# Patient Record
Sex: Male | Born: 1975 | Race: White | Hispanic: Yes | Marital: Single | Smoking: Former smoker
Health system: Southern US, Community
[De-identification: ages and names within clinical notes are randomized; demographics above are authoritative.]

## PROBLEM LIST (undated history)

## (undated) DIAGNOSIS — F1010 Alcohol abuse, uncomplicated: Secondary | ICD-10-CM

## (undated) DIAGNOSIS — K219 Gastro-esophageal reflux disease without esophagitis: Secondary | ICD-10-CM

## (undated) DIAGNOSIS — E079 Disorder of thyroid, unspecified: Secondary | ICD-10-CM

## (undated) DIAGNOSIS — F1910 Other psychoactive substance abuse, uncomplicated: Secondary | ICD-10-CM

## (undated) HISTORY — DX: Other psychoactive substance abuse, uncomplicated: F19.10

## (undated) HISTORY — DX: Disorder of thyroid, unspecified: E07.9

## (undated) HISTORY — DX: Gastro-esophageal reflux disease without esophagitis: K21.9

## (undated) HISTORY — PX: THYROIDECTOMY: SHX17

## (undated) HISTORY — DX: Alcohol abuse, uncomplicated: F10.10

---

## 2003-08-21 ENCOUNTER — Emergency Department (HOSPITAL_COMMUNITY): Admission: EM | Admit: 2003-08-21 | Discharge: 2003-08-21 | Payer: Self-pay | Admitting: *Deleted

## 2003-09-01 ENCOUNTER — Encounter: Admission: RE | Admit: 2003-09-01 | Discharge: 2003-09-01 | Payer: Self-pay | Admitting: Internal Medicine

## 2003-09-08 ENCOUNTER — Encounter: Admission: RE | Admit: 2003-09-08 | Discharge: 2003-09-08 | Payer: Self-pay | Admitting: Internal Medicine

## 2003-10-09 ENCOUNTER — Encounter: Admission: RE | Admit: 2003-10-09 | Discharge: 2003-10-09 | Payer: Self-pay | Admitting: Internal Medicine

## 2003-10-12 ENCOUNTER — Encounter (HOSPITAL_COMMUNITY): Admission: RE | Admit: 2003-10-12 | Discharge: 2004-01-10 | Payer: Self-pay | Admitting: Internal Medicine

## 2007-02-18 ENCOUNTER — Ambulatory Visit: Payer: Self-pay | Admitting: Sports Medicine

## 2007-02-18 ENCOUNTER — Encounter: Payer: Self-pay | Admitting: Family Medicine

## 2007-02-18 LAB — CONVERTED CEMR LAB
T3, Free: 3.3 pg/mL (ref 2.3–4.2)
TSH: 3.174 microintl units/mL (ref 0.350–5.50)

## 2007-02-21 ENCOUNTER — Encounter: Payer: Self-pay | Admitting: Family Medicine

## 2007-02-22 ENCOUNTER — Ambulatory Visit: Payer: Self-pay

## 2007-02-22 ENCOUNTER — Encounter: Payer: Self-pay | Admitting: Family Medicine

## 2007-02-22 LAB — CONVERTED CEMR LAB
ALT: 119 units/L — ABNORMAL HIGH (ref 0–53)
Albumin: 4.8 g/dL (ref 3.5–5.2)
Alkaline Phosphatase: 87 units/L (ref 39–117)
Potassium: 4.4 meq/L (ref 3.5–5.3)
Sodium: 141 meq/L (ref 135–145)
Total Bilirubin: 0.6 mg/dL (ref 0.3–1.2)
Total Protein: 7.5 g/dL (ref 6.0–8.3)

## 2007-02-27 ENCOUNTER — Telehealth: Payer: Self-pay | Admitting: *Deleted

## 2007-02-28 ENCOUNTER — Encounter: Payer: Self-pay | Admitting: Family Medicine

## 2007-03-04 ENCOUNTER — Ambulatory Visit (HOSPITAL_COMMUNITY): Admission: RE | Admit: 2007-03-04 | Discharge: 2007-03-04 | Payer: Self-pay | Admitting: Family Medicine

## 2007-03-15 ENCOUNTER — Encounter: Payer: Self-pay | Admitting: Family Medicine

## 2007-03-18 ENCOUNTER — Telehealth (INDEPENDENT_AMBULATORY_CARE_PROVIDER_SITE_OTHER): Payer: Self-pay | Admitting: Family Medicine

## 2007-03-20 ENCOUNTER — Encounter: Payer: Self-pay | Admitting: Family Medicine

## 2007-03-20 ENCOUNTER — Encounter (INDEPENDENT_AMBULATORY_CARE_PROVIDER_SITE_OTHER): Payer: Self-pay | Admitting: Interventional Radiology

## 2007-03-20 ENCOUNTER — Ambulatory Visit (HOSPITAL_COMMUNITY): Admission: RE | Admit: 2007-03-20 | Discharge: 2007-03-20 | Payer: Self-pay | Admitting: Family Medicine

## 2007-04-05 ENCOUNTER — Encounter: Payer: Self-pay | Admitting: Family Medicine

## 2007-04-11 ENCOUNTER — Telehealth: Payer: Self-pay | Admitting: *Deleted

## 2007-05-02 ENCOUNTER — Encounter: Payer: Self-pay | Admitting: Family Medicine

## 2007-05-06 ENCOUNTER — Encounter: Payer: Self-pay | Admitting: *Deleted

## 2007-05-07 ENCOUNTER — Encounter: Payer: Self-pay | Admitting: Family Medicine

## 2007-05-16 ENCOUNTER — Telehealth: Payer: Self-pay | Admitting: *Deleted

## 2007-07-04 ENCOUNTER — Ambulatory Visit (HOSPITAL_COMMUNITY): Admission: RE | Admit: 2007-07-04 | Discharge: 2007-07-05 | Payer: Self-pay | Admitting: Surgery

## 2007-07-04 ENCOUNTER — Encounter (INDEPENDENT_AMBULATORY_CARE_PROVIDER_SITE_OTHER): Payer: Self-pay | Admitting: Surgery

## 2007-07-11 ENCOUNTER — Encounter: Payer: Self-pay | Admitting: Family Medicine

## 2007-12-27 ENCOUNTER — Encounter: Payer: Self-pay | Admitting: Family Medicine

## 2008-02-15 ENCOUNTER — Encounter: Payer: Self-pay | Admitting: Family Medicine

## 2008-03-27 ENCOUNTER — Ambulatory Visit: Payer: Self-pay | Admitting: Family Medicine

## 2008-03-27 ENCOUNTER — Encounter: Payer: Self-pay | Admitting: Family Medicine

## 2008-03-27 DIAGNOSIS — E890 Postprocedural hypothyroidism: Secondary | ICD-10-CM

## 2008-03-30 LAB — CONVERTED CEMR LAB
Albumin: 5.2 g/dL (ref 3.5–5.2)
BUN: 17 mg/dL (ref 6–23)
CO2: 24 meq/L (ref 19–32)
Calcium: 10.2 mg/dL (ref 8.4–10.5)
Chloride: 102 meq/L (ref 96–112)
Creatinine, Ser: 1.02 mg/dL (ref 0.40–1.50)
Direct LDL: 128 mg/dL — ABNORMAL HIGH
Glucose, Bld: 84 mg/dL (ref 70–99)
Potassium: 3.9 meq/L (ref 3.5–5.3)
TSH: 16.044 microintl units/mL — ABNORMAL HIGH (ref 0.350–4.50)

## 2008-05-05 ENCOUNTER — Encounter: Payer: Self-pay | Admitting: Family Medicine

## 2008-05-05 ENCOUNTER — Ambulatory Visit: Payer: Self-pay | Admitting: Family Medicine

## 2008-05-06 LAB — CONVERTED CEMR LAB: TSH: 15.225 microintl units/mL — ABNORMAL HIGH (ref 0.350–4.50)

## 2008-07-24 ENCOUNTER — Encounter: Payer: Self-pay | Admitting: Family Medicine

## 2008-07-24 ENCOUNTER — Ambulatory Visit: Payer: Self-pay | Admitting: Family Medicine

## 2008-07-27 LAB — CONVERTED CEMR LAB: TSH: 0.875 microintl units/mL (ref 0.350–4.500)

## 2008-10-26 IMAGING — US US SOFT TISSUE HEAD/NECK
1 series · 14 of 25 positions shown · non-contrast
Comparison: none

CLINICAL DATA: Goiter.  
THYROID ULTRASOUND ? 03/04/07:
TECHNIQUE: Ultrasound examination of the thyroid gland and adjacent soft tissue structures was performed.   
No comparison ultrasound.  There is a comparison CT scan performed 10/27/03 at which time a large left thyroid mass was noted.

[Series 1: unknown · 0.09mm/px · 14 of 25 slices shown]
[im 1/25]
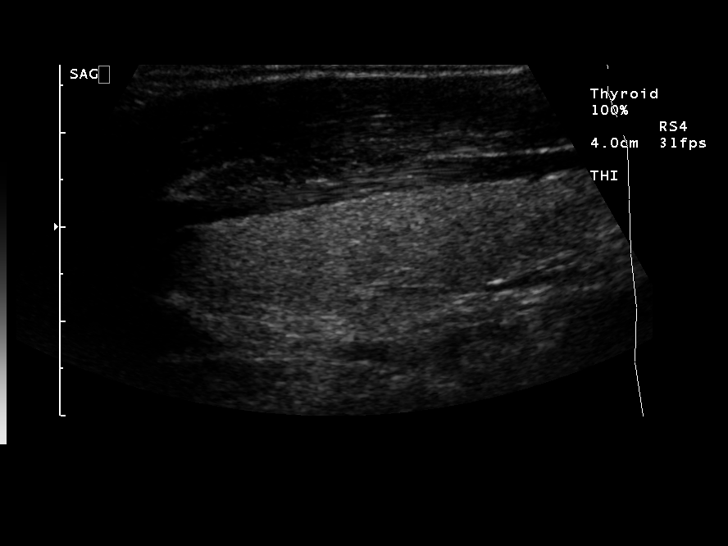
[im 3/25]
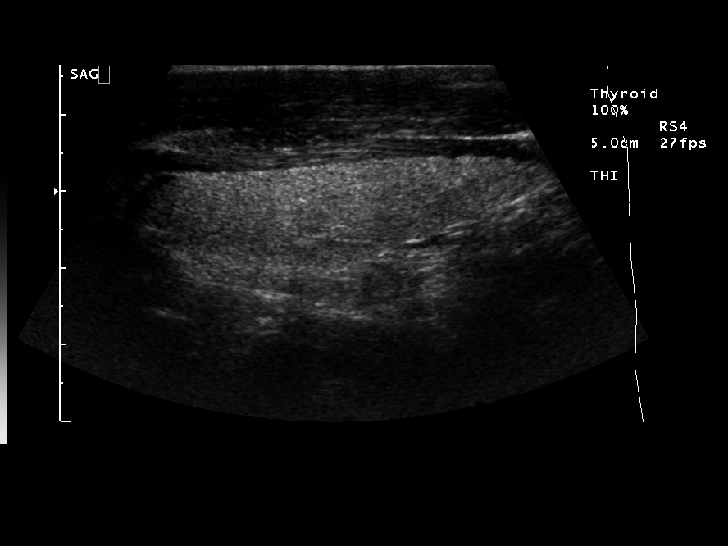
[im 5/25]
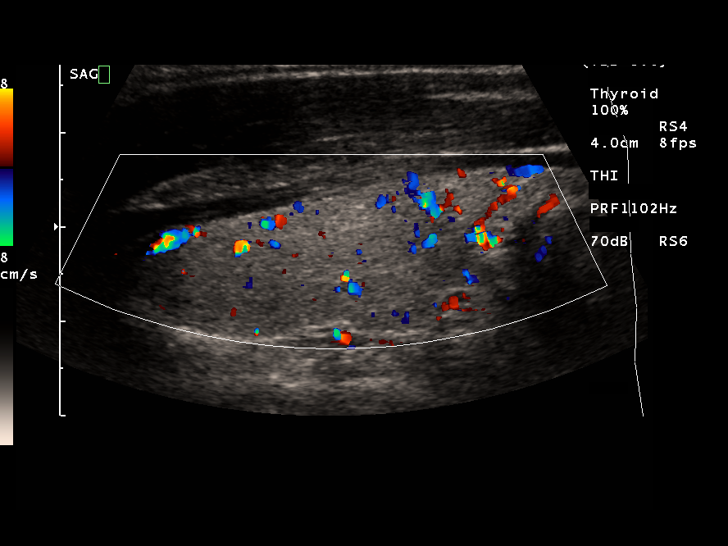
[im 7/25]
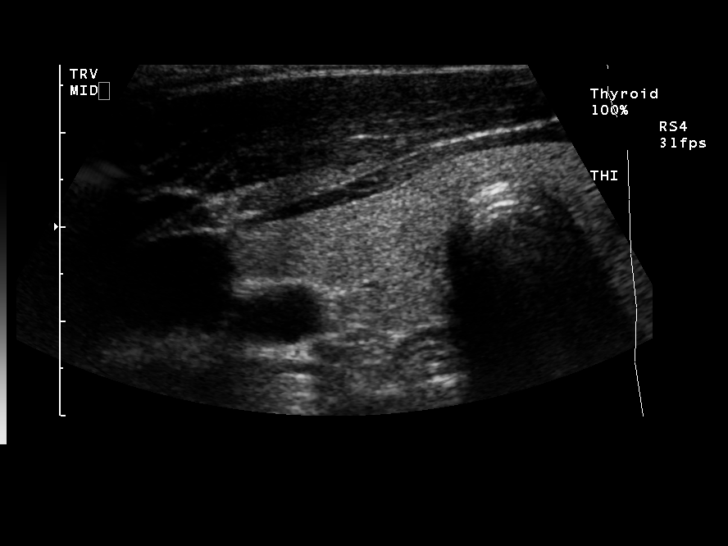
[im 9/25]
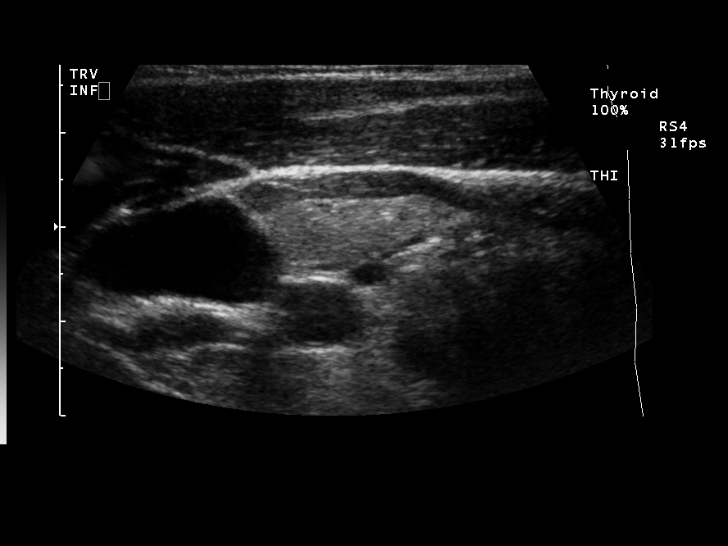
[im 10/25]
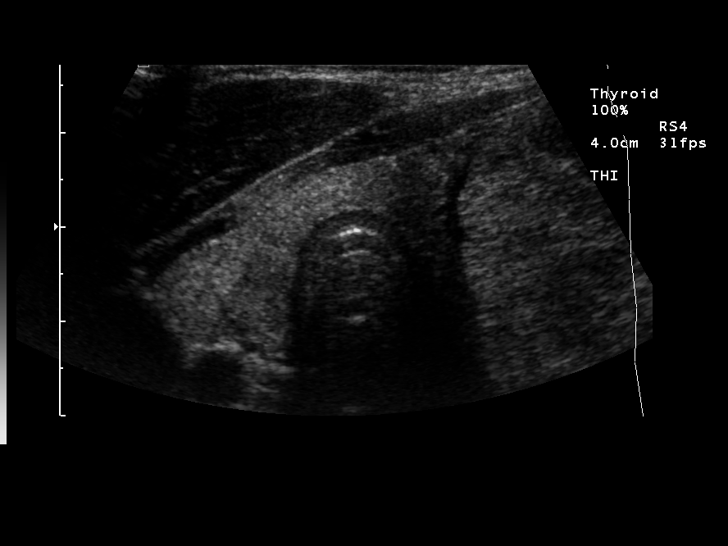
[im 12/25]
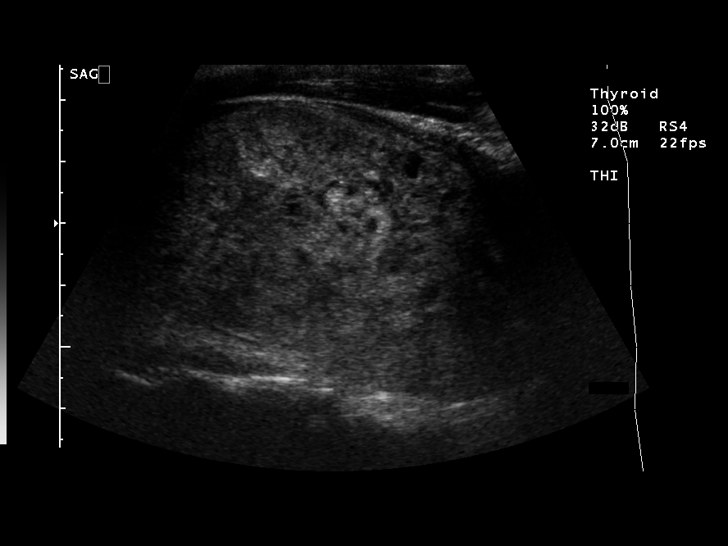
[im 14/25]
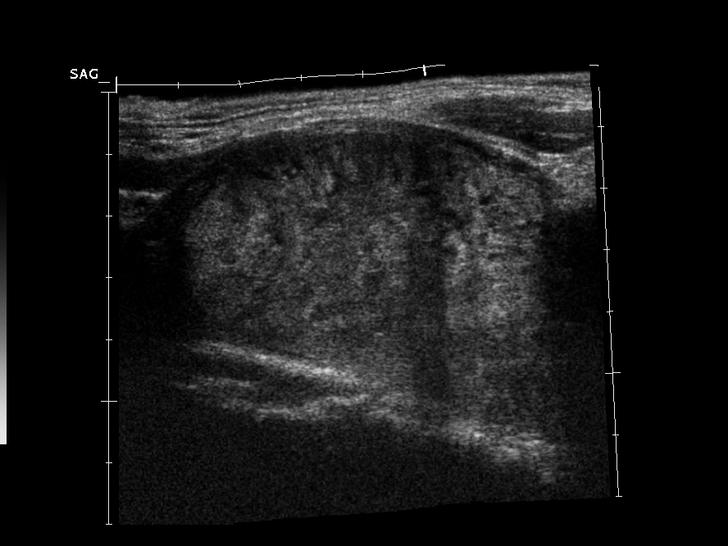
[im 16/25]
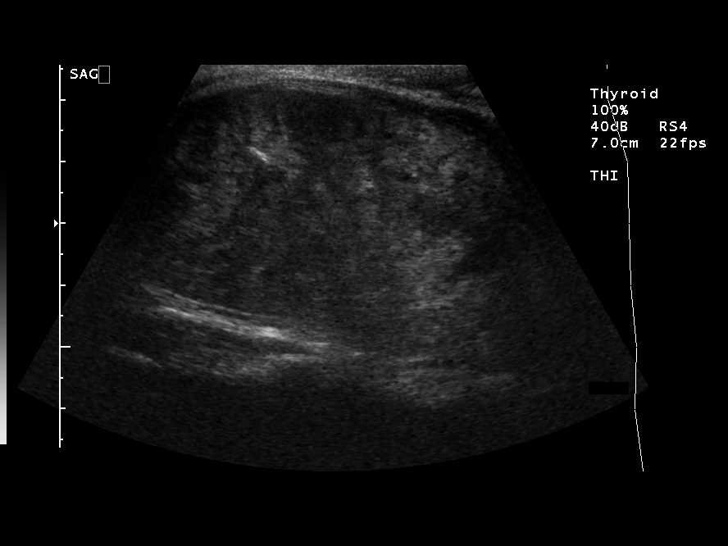
[im 17/25]
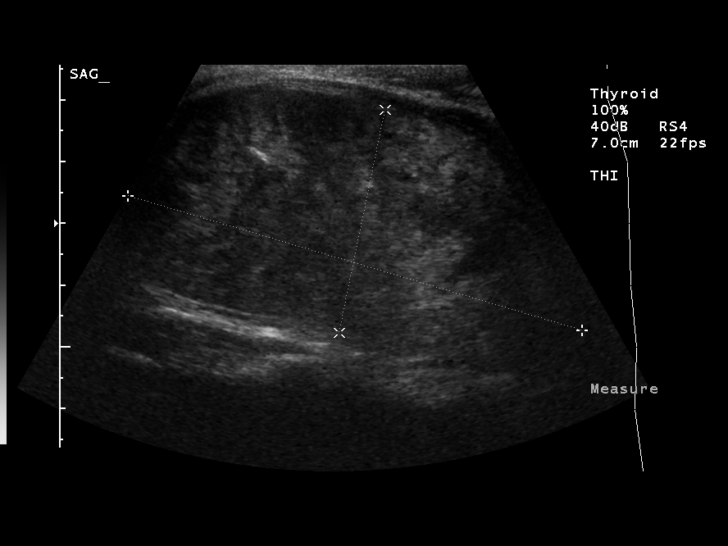
[im 19/25]
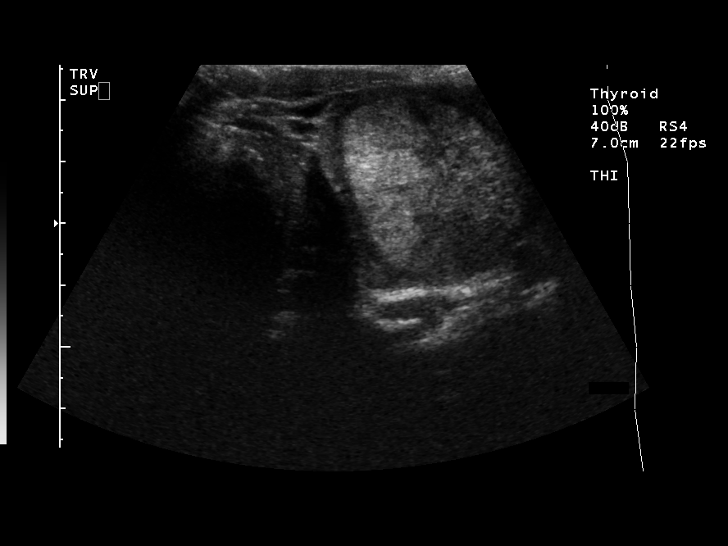
[im 21/25]
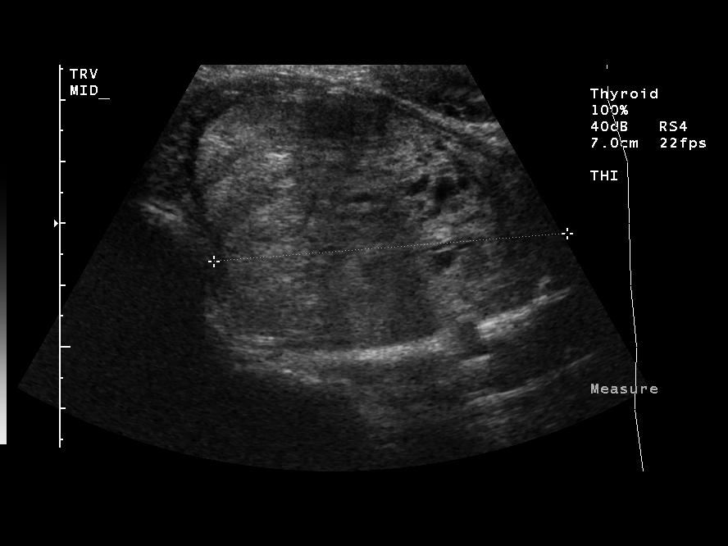
[im 23/25]
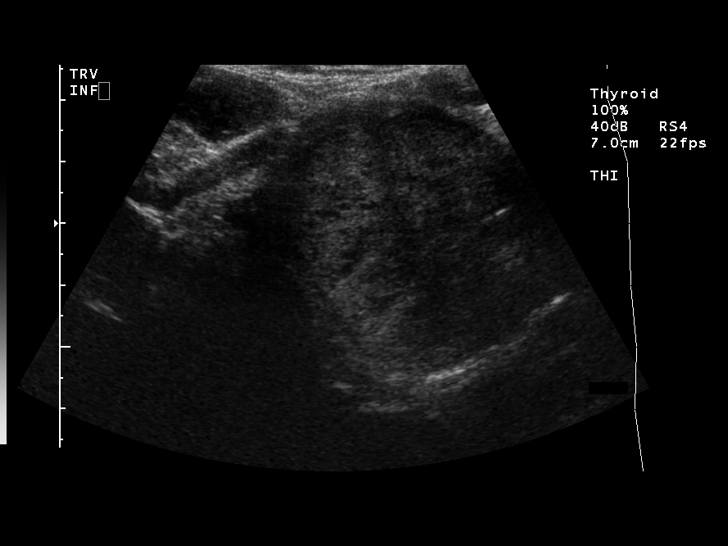
[im 25/25]
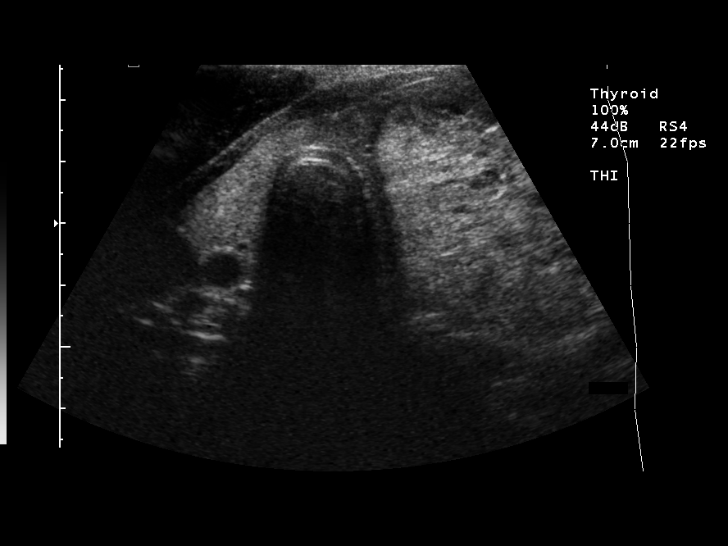

[14 of 25 positions shown; findings below may reference images not displayed]

FINDINGS: The right lobe of the thyroid gland measures 5.5 x 1.4 x 2.3 cm without a dominant mass. The left lobe of the thyroid gland measures 8.4 x 4.3 x 5.7 cm containing a dominant heterogeneous mass measuring 7.7 x 3.7 x 5.1 cm.  Fine needle aspirate recommended to exclude malignancy.  The isthmus is 0.5 cm.
IMPRESSION: Enlarge left lobe of thyroid gland containing a large dominant heterogeneous mass.  Fine needle aspirate recommended for further delineation to exclude an underlying malignancy.

## 2009-03-26 ENCOUNTER — Telehealth: Payer: Self-pay | Admitting: Family Medicine

## 2009-05-10 ENCOUNTER — Telehealth: Payer: Self-pay | Admitting: Family Medicine

## 2009-05-17 ENCOUNTER — Ambulatory Visit: Payer: Self-pay | Admitting: Family Medicine

## 2009-05-17 ENCOUNTER — Encounter: Payer: Self-pay | Admitting: Family Medicine

## 2009-10-04 ENCOUNTER — Ambulatory Visit: Payer: Self-pay | Admitting: Family Medicine

## 2009-10-04 ENCOUNTER — Encounter: Payer: Self-pay | Admitting: Family Medicine

## 2009-10-04 DIAGNOSIS — K219 Gastro-esophageal reflux disease without esophagitis: Secondary | ICD-10-CM

## 2009-10-04 DIAGNOSIS — F1010 Alcohol abuse, uncomplicated: Secondary | ICD-10-CM | POA: Insufficient documentation

## 2009-10-05 ENCOUNTER — Telehealth: Payer: Self-pay | Admitting: Family Medicine

## 2009-10-05 LAB — CONVERTED CEMR LAB
Albumin: 5.1 g/dL (ref 3.5–5.2)
Alkaline Phosphatase: 79 units/L (ref 39–117)
BUN: 9 mg/dL (ref 6–23)
Creatinine, Ser: 0.87 mg/dL (ref 0.40–1.50)
Glucose, Bld: 108 mg/dL — ABNORMAL HIGH (ref 70–99)
Potassium: 4.2 meq/L (ref 3.5–5.3)
Total Bilirubin: 0.4 mg/dL (ref 0.3–1.2)

## 2009-10-07 ENCOUNTER — Ambulatory Visit: Payer: Self-pay | Admitting: Family Medicine

## 2009-10-07 DIAGNOSIS — R109 Unspecified abdominal pain: Secondary | ICD-10-CM

## 2009-10-19 ENCOUNTER — Ambulatory Visit: Payer: Self-pay | Admitting: Family Medicine

## 2009-10-19 ENCOUNTER — Encounter: Payer: Self-pay | Admitting: Family Medicine

## 2009-10-19 LAB — CONVERTED CEMR LAB
Bilirubin Urine: NEGATIVE
Glucose, Urine, Semiquant: NEGATIVE
Ketones, urine, test strip: NEGATIVE
Lipase: 19 units/L (ref 0–75)
Protein, U semiquant: NEGATIVE
pH: 7.5

## 2009-10-21 ENCOUNTER — Ambulatory Visit (HOSPITAL_COMMUNITY): Admission: RE | Admit: 2009-10-21 | Discharge: 2009-10-21 | Payer: Self-pay | Admitting: Family Medicine

## 2009-10-26 ENCOUNTER — Telehealth: Payer: Self-pay | Admitting: Family Medicine

## 2009-10-27 ENCOUNTER — Telehealth (INDEPENDENT_AMBULATORY_CARE_PROVIDER_SITE_OTHER): Payer: Self-pay | Admitting: Family Medicine

## 2009-11-18 ENCOUNTER — Encounter: Payer: Self-pay | Admitting: Family Medicine

## 2009-11-18 ENCOUNTER — Ambulatory Visit: Payer: Self-pay | Admitting: Family Medicine

## 2009-11-19 LAB — CONVERTED CEMR LAB: TSH: 0.184 microintl units/mL — ABNORMAL LOW (ref 0.350–4.500)

## 2009-12-17 ENCOUNTER — Ambulatory Visit: Payer: Self-pay | Admitting: Family Medicine

## 2010-04-02 ENCOUNTER — Encounter: Payer: Self-pay | Admitting: Internal Medicine

## 2010-04-03 ENCOUNTER — Encounter: Payer: Self-pay | Admitting: Internal Medicine

## 2010-04-12 ENCOUNTER — Encounter: Payer: Self-pay | Admitting: Family Medicine

## 2010-04-12 ENCOUNTER — Ambulatory Visit
Admission: RE | Admit: 2010-04-12 | Discharge: 2010-04-12 | Payer: Self-pay | Source: Home / Self Care | Attending: Family Medicine | Admitting: Family Medicine

## 2010-04-12 NOTE — Progress Notes (Signed)
Summary: lab results  Phone Note Call from Patient Call back at Home Phone [phone]   Caller: Spouse-Gema Summary of Call: wants results of labs Initial call taken by: De Nurse,  October 05, 2009 11:17 AM  Follow-up for Phone Call        LM asking pt to call back Follow-up by: Golden Circle RN,  October 05, 2009 11:23 AM  Additional Follow-up for Phone Call Additional follow up Details #1::        wife called back. she gave me his [phone]. told her I will have a spanish speaking person call her spouse Additional Follow-up by: Golden Circle RN,  October 05, 2009 11:28 AM    Additional Follow-up for Phone Call Additional follow up Details #2::    used interpretor & asked him to come in thursday to see pcp to discuss results. appt made. told us he did not want an interpretor as his wife will be here & speak for him Follow-up by: Golden Circle RN,  October 05, 2009 2:18 PM

## 2010-04-12 NOTE — Progress Notes (Signed)
Summary: refill  Phone Note Refill Request Call back at Home Phone [phone] Message from:  spouse- Jema  Refills Requested: Medication #1:  SYNTHROID 175 MCG TABS 1 tab by mouth daily. Next Appointment Scheduled: 04/28/09 Initial call taken by: De Nurse,  March 26, 2009 11:22 AM  Follow-up for Phone Call        to pcp Follow-up by: Golden Circle RN,  March 26, 2009 11:41 AM    Prescriptions: SYNTHROID 175 MCG TABS (LEVOTHYROXINE SODIUM) 1 tab by mouth daily  #34 x 0   Entered and Authorized by:   Angeline Slim MD   Signed by:   Angeline Slim MD on 03/26/2009   Method used:   Electronically to        [facility]. [phone]* (retail)       [address]       [place], [place]  [address]       Ph: [phone]       Fax: [phone]   RxID:   [id number]

## 2010-04-12 NOTE — Assessment & Plan Note (Signed)
Summary: flu shot,tcb  Nurse Visit   Vital Signs:  Patient profile:   35 year old male Temp:     98.9 degrees F  Vitals Entered By: Theresia Lo RN (December 17, 2009 3:55 PM)  Allergies: No Known Drug Allergies  Immunizations Administered:  Influenza Vaccine # 1:    Vaccine Type: Fluvax 3+    Site: right deltoid    Mfr: GlaxoSmithKline    Dose: 0.5 ml    Route: IM    Given by: Theresia Lo RN    Exp. Date: 09/07/2010    Lot #: [id number]    VIS given: 10/05/09 version given December 17, 2009.  Flu Vaccine Consent Questions:    Do you have a history of severe allergic reactions to this vaccine? no    Any prior history of allergic reactions to egg and/or gelatin? no    Do you have a sensitivity to the preservative Thimersol? no    Do you have a past history of Guillan-Barre Syndrome? no    Do you currently have an acute febrile illness? no    Have you ever had a severe reaction to latex? no    Vaccine information given and explained to patient? yes  Orders Added: 1)  Flu Vaccine 58yrs + [90658] 2)  Admin 1st Vaccine [90471]   Orders Added: 1)  Flu Vaccine 72yrs + [90658] 2)  Admin 1st Vaccine [09811]

## 2010-04-12 NOTE — Progress Notes (Signed)
Summary: Rx Req  Phone Note Refill Request Call back at [phone] Message from:  SPOUSE-GEMA  Refills Requested: Medication #1:  SYNTHROID 175 MCG TABS 1 tab by mouth daily. PT USE [facility].  PT WORKING OUT OF TOWN RIGHT NOW WILL TRY AND GET BACK TO MAKE APPT FOR APRIL.  Initial call taken by: Clydell Hakim,  May 10, 2009 11:46 AM  Follow-up for Phone Call        Called wife back. Discussed that I cannot refill med anymore since pt has not been seen by me and has not been seen in clinic since 03/2008.  I am willing to prescribe 1 wk's worth of med if pt comes in this week for TSH checked.  Wife assured me that pt will come in to clinic this week for TSH check.  After that I would like to see pt in clinic.  Follow-up by: Raesha Coonrod MD,  May 10, 2009 3:13 PM    Prescriptions: SYNTHROID 175 MCG TABS (LEVOTHYROXINE SODIUM) 1 tab by mouth daily  #10 x 0   Entered and Authorized by:   Angeline Slim MD   Signed by:   Angeline Slim MD on 05/10/2009   Method used:   Electronically to        [facility]. [phone]* (retail)       [address]       [place], [place]  [address]       Ph: [phone]       Fax: [phone]   RxID:   [id number]

## 2010-04-12 NOTE — Assessment & Plan Note (Signed)
Summary: pt feels like something in his throat,tcb   Vital Signs:  Patient profile:   35 year old male Height:      63.75 inches Weight:      182 pounds BMI:     31.60 BSA:     1.88 Temp:     98.0 degrees F Pulse rate:   82 / minute BP sitting:   138 / 88  Vitals Entered By: Jone Baseman CMA (October 04, 2009 10:02 AM) CC: feels like something in his throat Is Patient Diabetic? No Pain Assessment Patient in pain? no        Primary Care Provider:  Cat Ta MD  CC:  feels like something in his throat.  History of Present Illness: pt has had sensation of something caught in his throat for several years and in fact this was present since before his thyroidectomy and has never really resolved.  However, he believes it is worse over the last 2 weeks.  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  He states that his throat feels dry and like a little bit of his food or pills get caught there.  It does not help to drink lots of water.  Pt is a smoker (only socially) and a heavy drinker (>6 beers 3x/week).  Pt has no dysphagia, can swallow liquids and solids.    Thyroid- Pt notes that his dose of synthroid was increased 4 months ago.  he has occasional episodes of anxiety, chest palpitations, that are sometimes associated with his throat symptoms but not always.    Habits & Providers  Alcohol-Tobacco-Diet     Tobacco Status: never  Current Medications (verified): 1)  Synthroid 200 Mcg Tabs (Levothyroxine Sodium) .Marland Kitchen.. 1 Tab By Mouth Daily For Thyroid 2)  Omeprazole 20 Mg Cpdr (Omeprazole) .... Take One By Mouth Each Day  Allergies (verified): No Known Drug Allergies  Review of Systems  The patient denies anorexia, fever, weight loss, chest pain, syncope, prolonged cough, headaches, abdominal pain, and severe indigestion/heartburn.    Physical Exam  General:  vital signs reviewed slightly  hypertensive, better on recheck Well-developed,well-nourished,in no acute distress; alert,appropriate and cooperative throughout examination Head:  Normocephalic and atraumatic without obvious abnormalities. No apparent alopecia or balding. Mouth:  pharynx pink and moist, no erythema, no exudates, no posterior lymphoid hypertrophy, and no postnasal drip.   Neck:  No deformities, masses, or tenderness noted. surgical scar noted post thyroidectomy Chest Wall:  No deformities, masses, tenderness or gynecomastia noted. Lungs:  Normal respiratory effort, chest expands symmetrically. Lungs are clear to auscultation, no crackles or wheezes. Heart:  Normal rate and regular rhythm. S1 and S2 normal without gallop, murmur, click, rub or other extra sounds. Abdomen:  mild epigastric tenderness, otherwise soft, non-tender, normal bowel sounds, no distention, no masses, and no guarding.   Skin:  Intact without suspicious lesions or rashes Cervical Nodes:  No lymphadenopathy noted Psych:  Oriented X3 and good eye contact.  slightly anxious, slightly depressed appearing   Impression & Recommendations:  Problem # 1:  GERD (ICD-530.81) Assessment New this problem is likely related to GERD.  pt is a heavy drinker which can contribute to symptoms.  pt denies much acid taste or heartburn but is tender over epigastrum and drinks heavily which can contribute.  very unlikely to be due to another abd process.  pt's case is complicated slightly by his  thyroid disease and recent med increase.  If he is hyperthyroid and this is giving him symptoms, maybe that could contribute.  However, VS are stable and heart is not racing now, but has symptoms.  will treat with PPI.  f/u with PCP. His updated medication list for this problem includes:    Omeprazole 20 Mg Cpdr (Omeprazole) .Marland Kitchen... 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  PCP to adjust medication. His updated medication list for this problem includes:    Synthroid 200 Mcg Tabs (Levothyroxine sodium) .Marland Kitchen... 1 tab by mouth daily for thyroid  Orders: TSH-[facility] (62130-86578) [facility]- Est  Level 4 (46962)  Problem # 3:  ALCOHOL ABUSE (ICD-305.00) Assessment: New  pt drinks heavily 3x/ week.  wife became tearful when discussing this.  pt is open to the idea of beginning to cut back.  framed it in terms of his general well being as well as his GERD symptoms.  Wife would like him to cut back, apparently this is a source of stress for them.  began discussion today, PCP to continue.  Orders: [facility]- Est  Level 4 ([address]284)  Complete Medication List: 1)  Synthroid 200 Mcg Tabs (Levothyroxine sodium) .Marland Kitchen.. 1 tab by mouth daily for thyroid 2)  Omeprazole 20 Mg Cpdr (Omeprazole) .... Take one by mouth each day  Patient Instructions: 1)  It was nice to see you today. 2)  come back in 3-4 weeks to see Dr. Janalyn Harder 3)  We will check your thyroid level and call tomorrow with the new perscription 4)  Today we talked about acid reflux causing these symptoms and the role that beer may play in this.  Reducing your alcohol intake will help you feel better 5)  Avoid foods high in acid(tomatoes, citrus juices,spicy foods).Avoid eating within two hours of lying down or before exercising. Do not over eat: try smaller more frequent meals. Elevate head of bed twelve inches when sleeping.  6)  Also, while you are outside, try to drink some sports drinks as well as water, like gaterade. 7)  Fue agradable ver que [employer]. 8)  regrese en 3-4 semana por una cita con  Dr. Janalyn Harder 9)   Vamos a comprobar el nivel de la tiroides y Teaching laboratory technician con la nueva receta mdica 10)   Hoy hemos hablado sobre el reflujo cido que causa estos sntomas y el papel que la cerveza puede desempear en esto. Reducir el consumo de alcohol le ayudar a sentirse mejor 11)    Evite los alimentos ricos en cidos (tomate, jugos ctricos, los alimentos picantes). Evite comer dos horas despus de acostarse o antes de hacer ejercicio. No ms de comer: probar comidas ms pequeas con mayor frecuencia. Elevar la cabecera de la cama al dormir doce pulgadas. 12)   Adems, mientras se encuentra fuera, trate de beber algunas bebidas deportivas, as como el agua, como gaterade. Prescriptions: OMEPRAZOLE 20 MG CPDR (OMEPRAZOLE) take one by mouth each day  #30 x 11   Entered and Authorized by:   Ellery Plunk MD   Signed by:   Ellery Plunk MD on 10/04/2009   Method used:   Print then Give to Patient   RxID:   [id number]

## 2010-04-12 NOTE — Assessment & Plan Note (Signed)
Summary: f/up,tcb   Vital Signs:  Patient profile:   35 year old male Height:      63.75 inches Weight:      175 pounds BMI:     30.38 Temp:     98.4 degrees F oral Pulse rate:   69 / minute BP sitting:   116 / 74  (left arm) Cuff size:   regular  Vitals Entered By: Tessie Fass CMA (October 19, 2009 9:01 AM) CC: F/U Pain Assessment Patient in pain? yes     Location: abdomen Intensity: 6   Primary Care Provider:  Cat Ta MD  CC:  F/U.  History of Present Illness: 35 y/o M here for f/u of sensation of throat dryness thought to be 2/2 reflux and pt has since been started on omeprazole   ABDOMINAL PAIN Location: LUQ Onset: 1 month  Description: dull pain, pain worse with palpation, no pain when he is sitting in office, but only when he touches the area.  pain started again yesterday and has not decreased Modifying factors:    Symptoms Nausea/Vomiting: no Diarrhea:no  Constipation: no, last BM this morning and last night.  usually has 2 bm/day Melena/BRBPR: no Hematemesis: no Anorexia: no Fever/Chills: no Jaundice: no Dysuria: no Back pain: yes, present for 1 month now, same time as when abd pain started Rash: no Weight loss: no STD exposure: no Alcohol use: since 2 wks ago, he has stopped drinking alcohol. but before that he drank 6 beers/day NSAID use:   PMH: GERD dx 1 month ago Past Surgeries: thyroid      Current Medications (verified): 1)  Omeprazole 20 Mg Cpdr (Omeprazole) .... Take One By Mouth Each Day 2)  Synthroid 175 Mcg Tabs (Levothyroxine Sodium) .Marland Kitchen.. 1 Tab By Mouth Daily For Thyroid.  Please Use The Same Manufactureer For The Medicine 3)  Metamucil 30.9 % Powd (Psyllium) .Marland Kitchen.. 1 Gram By Mouth Daily. Dispense For 1 Month.  Allergies (verified): No Known Drug Allergies  Past History:  Past Medical History: Last updated: 03/27/2008 Postsurgical hypothyroidism  Past Surgical History: Last updated: 03/27/2008 Thyroidectomy - noncancerous  goiter  Family History: Last updated: 02/18/2007 Mother: HTN Father: None Brothers: Diabetes Sisters: HTN  Social History: Last updated: 10/07/2009 Lives with wife and three sons.  Wife is expecting another child.  Pt is from [place] and came to the [place] in 1999. Occupation: Holiday representative Married Never Smoked Alcohol use-yes; 5-6 beer per day Drug use-no Regular exercise-no  Risk Factors: Exercise: no (02/18/2007)  Risk Factors: Smoking Status: never (10/07/2009)  Review of Systems       per hpi   Physical Exam  General:  Well-developed,well-nourished,in no acute distress; alert,appropriate and cooperative throughout examination. vitals reviewed.  Abdomen:  soft, normal bowel sounds, no distention, no masses, no rigidity, no abdominal hernia, no inguinal hernia, no hepatomegaly, no splenomegaly, guarding, epigastric tenderness, and LUQ tenderness.   Rectal:  Hemocult negative    Impression & Recommendations:  Problem # 1:  ABDOMINAL PAIN (ICD-789.00) Assessment New LUQ abd pain.  Etiology includes pancreatitis vs pud vs splenic vs colonic process.  UA negative.  Will check lipase given pt's alcohol history.  Cmet done in July showed mildly elevated ALT, otherwise wnl.  Pt taking omeprazole x 2 wks which relieved throat dryines, but not abd pain.  Hemocult negative, which makes pud less likely.   Will get abd u/s.  If negative, may consider urease test or barium swallow test for UGI series.  Orders: Urinalysis-[facility] (  00000) Lipase-[facility] (84[address]-44010) Ultrasound (Ultrasound) [facility]- Est Level  3 (27253)  Complete Medication List: 1)  Omeprazole 20 Mg Cpdr (Omeprazole) .... Take one by mouth each day 2)  Synthroid 175 Mcg Tabs (Levothyroxine sodium) .Marland Kitchen.. 1 tab by mouth daily for thyroid.  please use the same manufactureer for the medicine 3)  Metamucil 30.9 % Powd (Psyllium) .Marland Kitchen.. 1 gram by mouth daily. dispense for 1 month.  Laboratory Results   Urine Tests  Date/Time  Received: October 19, 2009 9:26 AM  Date/Time Reported: October 19, 2009 9:31 AM   Routine Urinalysis   Color: yellow Appearance: Clear Glucose: negative   (Normal Range: Negative) Bilirubin: negative   (Normal Range: Negative) Ketone: negative   (Normal Range: Negative) Spec. Gravity: 1.020   (Normal Range: 1.003-1.035) Blood: negative   (Normal Range: Negative) pH: 7.5   (Normal Range: 5.0-8.0) Protein: negative   (Normal Range: Negative) Urobilinogen: 0.2   (Normal Range: 0-1) Nitrite: negative   (Normal Range: Negative) Leukocyte Esterace: negative   (Normal Range: Negative)    Comments: ...............test performed by......Marland KitchenBonnie A. [place], MLS (ASCP)cm

## 2010-04-12 NOTE — Progress Notes (Signed)
Summary: phn msg  Phone Note Call from Patient Call back at [phone]   Caller: Spouse Gema Summary of Call: Pt requesting that someone call back that can speak Spanish with the results. Initial call taken by: Clydell Hakim,  October 27, 2009 9:08 AM  Follow-up for Phone Call        I called wife back and gave her message from Dr. Janalyn Harder and she voices understanding.  see previous note.  Follow-up by: Theresia Lo RN,  October 27, 2009 9:27 AM

## 2010-04-12 NOTE — Progress Notes (Signed)
Summary: Test Res  Phone Note Call from Patient Call back at Home Phone [phone]   Caller: spouse-Gema Summary of Call: Wondering what results of ultrasound was from last week. Initial call taken by: Clydell Hakim,  October 26, 2009 11:18 AM  Follow-up for Phone Call        will forward message to MD. Follow-up by: Theresia Lo RN,  October 26, 2009 11:22 AM    I tried calling that number on Mon but it was disconnected. Cat Ta MD  October 27, 2009 8:43 AM No answer today.  Left message: Ultrasound showed that there is  1)68mm gall stone: should not be causing the abd pain 2)a lot of gas, which may be cause of some abd discomfort.   3)cont taking ppi as prescribed. 4)Lipase normal  Cat Ta MD  October 27, 2009 8:48 AM   spoke with wife because patient only speaks spanish. gave her message agian form Dr. Janalyn Harder as she did not understand message that was left. she states patient continues having the same pain since for several weeks now. will send  message to MD .  Theresia Lo RN  October 27, 2009 9:26 AM  Called back at above number.  Discussed with wife the results of ultrasound.  Would like pt to continue omeprazole and gas-x.  If still having pain in 2 months, I would like to see pt again. Wife agreed.  Cat Ta MD  October 27, 2009 5:55 PM

## 2010-04-12 NOTE — Assessment & Plan Note (Signed)
Summary: discuss labs/[place]/Lizzet Hendley   Vital Signs:  Patient profile:   35 year old male Height:      63.75 inches Weight:      174 pounds BMI:     30.21 Temp:     98.1 degrees F oral Pulse rate:   71 / minute BP sitting:   148 / 81  (left arm)  Vitals Entered By: Jimmy Footman, CMA (October 07, 2009 3:33 PM) CC: f/u on labwork, loss of appitite Is Patient Diabetic? No Pain Assessment Patient in pain? yes     Location: left side   Primary Care Provider:  Onesty Clair MD  CC:  f/u on labwork and loss of appitite.  History of Present Illness: This is my first time meeting patient.    35 y/o M is here to discuss thyroid.  He is s/p thyroidectomy and has been taking synthroid,but wife wants to discuss other things.   Thyroid:  feeling some fatigue.  no constipation.  BM normal until today, BM was soft. No diarrhea.  No mental status changes.    ABDOMINAL PAIN Location: epigastric to left upper quadrant Onset: 1 week Description: burning sensation, feels like someone is punching his stomach, pain radiated to back and chest.  Feels like pressure in head.   Symptoms Nausea/Vomiting:  no Diarrhea: soft stools since yesterday, but no diarrhea Constipation: no Melena/BRBPR: no Hematemesis: no Anorexia: 8 lbs weight loss since last visit 3 days ago  Fever/Chills: no fever, +chills on Mon Jaundice: no Dysuria: no Back pain: yes, abd pain radiates to back  Rash: no Weight loss: yes, 8 lbs in 3 days STD exposure: no Alcohol use: yes, 5-6 beers per day NSAID use: no Past Surgeries: thyroid  DRY THROAT:  seen by Dr Annita Brod 3 days ago.  Was Rx ppi.  Pt is taking prilosec otc.  Still complaining fo dry throat.  see her note from 7/25.  see ros from above.    Etoh abuse: drinks 5-6 beers daily.       Habits & Providers  Alcohol-Tobacco-Diet     Tobacco Status: never  Current Medications (verified): 1)  Omeprazole 20 Mg Cpdr (Omeprazole) .... Take One By Mouth Each Day 2)  Synthroid 175  Mcg Tabs (Levothyroxine Sodium) .Marland Kitchen.. 1 Tab By Mouth Daily For Thyroid.  Please Use The Same Manufactureer For The Medicine 3)  Metamucil 30.9 % Powd (Psyllium) .Marland Kitchen.. 1 Gram By Mouth Daily. Dispense For 1 Month.  Allergies (verified): No Known Drug Allergies  Past History:  Past Surgical History: Last updated: 03/27/2008 Thyroidectomy - noncancerous goiter  Family History: Last updated: 02/18/2007 Mother: HTN Father: None Brothers: Diabetes Sisters: HTN  Social History: Last updated: 10/07/2009 Lives with wife and three sons.  Wife is expecting another child.  Pt is from [place] and came to the [place] in 1999. Occupation: Holiday representative Married Never Smoked Alcohol use-yes; 5-6 beer per day Drug use-no Regular exercise-no  Risk Factors: Exercise: no (02/18/2007)  Risk Factors: Smoking Status: never (10/07/2009)  Past Medical History: Reviewed history from 03/27/2008 and no changes required. Postsurgical hypothyroidism  Social History: Lives with wife and three sons.  Wife is expecting another child.  Pt is from [place] and came to the [place] in 1999. Occupation: Holiday representative Married Never Smoked Alcohol use-yes; 5-6 beer per day Drug use-no Regular exercise-no  Review of Systems       per hpi  Physical Exam  General:  Well-developed,well-nourished,in no acute distress; alert,appropriate and cooperative throughout examination. vitals reviewed.  Neck:  No deformities, masses, or tenderness noted. Abdomen:  Bowel sounds positive,abdomen soft and  without masses, organomegaly or hernias noted. Pt endorses pain to deep palpation of LLQ, but there was no guarding.  no distention, no masses, no guarding, no rigidity, no rebound tenderness, no abdominal hernia, and no inguinal hernia.   Hyperactive bowel sounds Neurologic:  alert & oriented X3.   Cervical Nodes:  No lymphadenopathy noted   Impression & Recommendations:  Problem # 1:  ABDOMINAL PAIN (ICD-789.00) Assessment  New Abd exam is benign, besides hyperactive bowel sounds.  Pt had cmet that showed midly elevated AST/ALT, otherwise normal.  Will monitor for now.  Will try metamucil given hyperactive bowel sounds.  Discussed watching what he eats to identify cause of gas.   Orders: [facility]- Est  Level 4 ([address]214)  Problem # 2:  POSTSURGICAL HYPOTHYROIDISM (ICD-244.0) Assessment: Improved TSH improved and is 0.5, but most likely too low given complaints of fatigue and "not feeling right"  Will dec synthroid to 175.  Will recheck TSH in 6 wks.   The following medications were removed from the medication list:    Synthroid 200 Mcg Tabs (Levothyroxine sodium) .Marland Kitchen... 1 tab by mouth daily for thyroid His updated medication list for this problem includes:    Synthroid 175 Mcg Tabs (Levothyroxine sodium) .Marland Kitchen... 1 tab by mouth daily for thyroid.  please use the same manufactureer for the medicine  Orders: [facility]- Est  Level 4 ([address])Future Orders: TSH-[facility] ([id number]) ... 09/24/2010  Problem # 3:  GERD (ICD-530.81) Pt still complaining of sensation of dry throat.  I spent a lot of time discussing the need to remain on med prescribed by Dr Annita Brod.  Pt has only been on treatment for 2 days and therefore cannot expect quick relief of symptoms.   His updated medication list for this problem includes:    Omeprazole 20 Mg Cpdr (Omeprazole) .Marland Kitchen... Take one by mouth each day  Orders: [facility] Est  Level 4 ([address]284)  Problem # 4:  ALCOHOL ABUSE (ICD-305.00) Advised cessation.  Pt appears anxious and fidgety.  Last drink was 4-5 days ago.  It may be that some of his complaints of "feeling" not right is from withdrawal.   Orders: [facility]- Est  Level 4 ([address]214)  Complete Medication List: 1)  Omeprazole 20 Mg Cpdr (Omeprazole) .... Take one by mouth each day 2)  Synthroid 175 Mcg Tabs (Levothyroxine sodium) .Marland Kitchen.. 1 tab by mouth daily for thyroid.  please use the same manufactureer for the medicine 3)  Metamucil 30.9 % Powd (Psyllium) .Marland Kitchen.. 1  gram by mouth daily. dispense for 1 month. Prescriptions: METAMUCIL 30.9 % POWD (PSYLLIUM) 1 gram by mouth daily. Dispense for 1 month.  #1 x 3   Entered and Authorized by:   Angeline Slim MD   Signed by:   Angeline Slim MD on 10/07/2009   Method used:   Electronically to        [facility]. [phone]* (retail)       [address]       [place], [place]  [address]       Ph: [phone]       Fax: [phone]   RxID:   [id number] SYNTHROID 175 MCG TABS (LEVOTHYROXINE SODIUM) 1 tab by mouth daily for thyroid.  Please use the same manufactureer for the medicine  #30 x 1   Entered and Authorized by:   Angeline Slim MD  Signed by:   Angeline Slim MD on 10/07/2009   Method used:   Electronically to        [facility]. [phone]* (retail)       [address]       [place], [place]  [address]       Ph: [phone]       Fax: [phone]   RxID:   [id number]

## 2010-05-23 ENCOUNTER — Other Ambulatory Visit: Payer: Self-pay

## 2010-05-23 DIAGNOSIS — E890 Postprocedural hypothyroidism: Secondary | ICD-10-CM

## 2010-05-23 LAB — TSH: TSH: 3.832 u[IU]/mL (ref 0.350–4.500)

## 2010-05-24 ENCOUNTER — Telehealth: Payer: Self-pay | Admitting: Family Medicine

## 2010-05-24 MED ORDER — LEVOTHYROXINE SODIUM 150 MCG PO TABS: 150.0000 ug | ORAL_TABLET | Freq: Every day | ORAL | Status: DC

## 2010-05-24 NOTE — Telephone Encounter (Signed)
Refill for 3 months.  TSH wnl.

## 2010-06-01 ENCOUNTER — Telehealth: Payer: Self-pay | Admitting: Family Medicine

## 2010-06-01 NOTE — Telephone Encounter (Signed)
Requesting lab results from last visit

## 2010-06-01 NOTE — Telephone Encounter (Signed)
To Ta

## 2010-06-02 NOTE — Telephone Encounter (Signed)
Called pt again. Left message again. I called on 05/24/10 and left message that TSH was normal. Refilled med for 3 month.

## 2010-06-06 ENCOUNTER — Other Ambulatory Visit: Payer: Self-pay | Admitting: Family Medicine

## 2010-06-06 NOTE — Telephone Encounter (Signed)
Refill request

## 2010-06-07 NOTE — Telephone Encounter (Signed)
rfill request for patient of Dr.Ta's

## 2010-06-27 NOTE — Telephone Encounter (Signed)
If pt returns call, give below message, will close encounter.

## 2010-07-26 NOTE — Op Note (Signed)
[name], [name]      ACCOUNT NO.:  [id number]   MEDICAL RECORD NO.:  [id number]          PATIENT TYPE:  OIB   LOCATION:  5151                         FACILITY:  [facility]   PHYSICIAN:  Velora Heckler, MD      DATE OF BIRTH:  February 03, 1976   DATE OF PROCEDURE:  07/04/2007  DATE OF DISCHARGE:                               OPERATIVE REPORT   PREOPERATIVE DIAGNOSIS:  Thyroid goiter with compressive symptoms.   POSTOPERATIVE DIAGNOSIS:  Thyroid goiter with compressive symptoms.   PROCEDURE:  Total thyroidectomy.   SURGEON:  Velora Heckler, MD, FACS   ANESTHESIA:  General.   ESTIMATED BLOOD LOSS:  Minimal.   PREPARATION:  Betadine.   COMPLICATIONS:  None.   INDICATIONS:  The patient is a 35 year old Hispanic male referred for  [facility] by Dr. Norton Blizzard, with thyroid  goiter.  The patient had been initially diagnosed in August of 2005.  He  had gradual enlargement particularly in the left thyroid lobe.  This  caused tracheal deviation.  The patient has developed symptoms of  dysphagia, dyspnea, pressure sensation, globus sensation, and shortness  of breath.  He now comes to surgery for resection.     The patient was brought to the operating room, placed in supine  position on the operating room table.  Following administration of  general anesthesia, the patient is positioned and then prepped and  draped in the usual strict aseptic fashion.  After ascertaining an  adequate level of anesthesia been achieved, a Kocher incision was made  with #15 blade.  Dissection was carried through subcutaneous tissues.  Platysma was divided with the electrocautery.  Subplatysmal planes are  developed cephalad and caudad from the thyroid notch to the sternal  notch.  A Mahorner self-retaining retractors placed for exposure.  Strap  muscles are incised in the midline and dissection is begun on the left  side of the neck.  Strap muscles were reflected over the  markedly  enlarged left thyroid lobe.  Lobe was gently dissected out.  Middle  thyroid vein is divided between medium Ligaclips with the harmonic  scalpel.  Superior pole vessels were dissected out and divided between  medium Ligaclips with the harmonic scalpel.  Inferior venous tributaries  are divided between medium Ligaclips with the harmonic scalpel.  Gland  is rolled anteriorly.  Branches of the inferior thyroid artery are  divided between small and medium Ligaclips with the harmonic scalpel.  Parathyroid tissue is identified and preserved.  Recurrent laryngeal  nerve was identified and preserved.  Dissection was carried down to the  airway and the ligament of Allyson Sabal is transected with the electrocautery,  leaving a tiny remnant of thyroid tissue adjacent to the recurrent  nerve.  Gland is mobilized across the midline.  Isthmus was mobilized.  A small pyramidal lobe is resected off the thyroid cartilage and  included with the specimen.  Dry pack is placed in the left neck.   We turned our attention to the right side.  Right thyroid lobe is more  normal in size.  There are no dominant masses.  Strap muscles were  reflected laterally.  Right lobe was exposed.  Middle vein is divided  between Ligaclips with the harmonic scalpel.  Superior pole vessels are  dissected out and divided between medium Ligaclips with the harmonic  scalpel.  Gland is rolled anteriorly.  Inferior venous tributaries are  divided between medium Ligaclips with the harmonic scalpel.  Branches of  the inferior thyroid artery are divided between small Ligaclips with the  harmonic scalpel.  Ligament of Allyson Sabal is transected with the  electrocautery, leaving again a small less than 0.5 cm fragment of  thyroid tissue adjacent to the recurrent nerve.  Gland is excised off  the airway.  Sutures used to mark the right superior pole.  Gland is  submitted in its entirety to pathology for review.  Good hemostasis was  obtained  in the neck.  Neck is irrigated with warm saline bilaterally.  Surgicel was placed in the operative field.  Strap muscles were  reapproximated midline with interrupted 3-0 Vicryl sutures.  Platysma  was closed with interrupted 3-0 Vicryl sutures.  Skin was closed with  running 4-0 Monocryl subcuticular suture.  Wound is washed and dried in  benzoin, and Steri-Strips are applied.  Sterile dressings are applied.  The patient is awakened from anesthesia and brought to the recovery room  in stable condition.  The patient tolerated the procedure well.      Velora Heckler, MD  Electronically Signed     TMG/MEDQ  D:  07/04/2007  T:  07/04/2007  Job:  [id number]   cc:   Norton Blizzard, M.D.

## 2010-11-22 ENCOUNTER — Encounter: Payer: Self-pay | Admitting: Family Medicine

## 2010-11-22 ENCOUNTER — Ambulatory Visit (INDEPENDENT_AMBULATORY_CARE_PROVIDER_SITE_OTHER): Payer: Self-pay | Admitting: Family Medicine

## 2010-11-22 DIAGNOSIS — Z23 Encounter for immunization: Secondary | ICD-10-CM

## 2010-11-22 DIAGNOSIS — E890 Postprocedural hypothyroidism: Secondary | ICD-10-CM

## 2010-11-22 DIAGNOSIS — K219 Gastro-esophageal reflux disease without esophagitis: Secondary | ICD-10-CM

## 2010-11-22 MED ORDER — INFLUENZA VIRUS VACC SPLIT PF IM SUSP: 0.5000 mL | Freq: Once | INTRAMUSCULAR | Status: DC

## 2010-11-22 MED ORDER — LEVOTHYROXINE SODIUM 150 MCG PO TABS: 150.0000 ug | ORAL_TABLET | Freq: Every day | ORAL | Status: DC

## 2010-11-22 MED ORDER — OMEPRAZOLE 20 MG PO CPDR
20.0000 mg | DELAYED_RELEASE_CAPSULE | Freq: Every day | ORAL | Status: DC
Start: 2010-11-22 — End: 2011-09-19

## 2010-11-22 NOTE — Assessment & Plan Note (Signed)
Will refill prilosec per pt request.

## 2010-11-22 NOTE — Patient Instructions (Addendum)
It was nice to meet you! We are checking your thyroid and I am restarting your thyroid medicine. Come back in 6 months.

## 2010-11-22 NOTE — Progress Notes (Signed)
S: Pt comes in today for follow up.  Needs refill on his synthroid, has been out x 1 week. NO s/s of hypo or hyperthyroidism. No palpitations or shakiness.  Some fatigue but thinks it is because he does not always sleep well.  Occasional grogginess.  Overall, feels very well.    ROS: Per HPI  History  Smoking status  . Current Some Day Smoker  . Types: Cigarettes  Smokeless tobacco  . Never Used  Comment: smokes very little. some times.   Smokes occasionally, 1 pack may last a month, if he even buys a pack. Drinks 2-6 beers at a time, 2-3x/week but not consecutive days No drug use  O:  Filed Vitals:   11/22/10 1557  BP: 154/90  Pulse: 71    Gen: NAD HEENT: MMM, pharynx clear, no thyromegaly or nodules noted.  CV: RRR, no murmur Pulm: CTA bilat, no wheezes or crackles Ext: Warm, no chronic skin changes, no edema   A/P: 35 y.o. male p/w hypothyroidism 2/2 surgery -See problem list -f/u in 6 months

## 2010-11-22 NOTE — Assessment & Plan Note (Signed)
Pt out of synthroid x1 week. Will refill at same dose.  Will also check TSH, although this will be less useful since pt has been off of medications.  Recheck in 6 months.

## 2010-11-23 ENCOUNTER — Encounter: Payer: Self-pay | Admitting: Family Medicine

## 2010-12-06 LAB — CBC
HCT: 45.3
Hemoglobin: 16.1
MCHC: 35.6
RDW: 13.4

## 2010-12-06 LAB — BASIC METABOLIC PANEL
BUN: 15
Chloride: 103
Glucose, Bld: 93
Potassium: 3.9

## 2010-12-06 LAB — URINE MICROSCOPIC-ADD ON

## 2010-12-06 LAB — DIFFERENTIAL
Basophils Absolute: 0
Basophils Relative: 1
Eosinophils Absolute: 0.4
Eosinophils Relative: 4
Monocytes Absolute: 0.7

## 2010-12-06 LAB — URINALYSIS, ROUTINE W REFLEX MICROSCOPIC
Glucose, UA: 100 — AB
Hgb urine dipstick: NEGATIVE
Ketones, ur: NEGATIVE
Protein, ur: NEGATIVE

## 2010-12-06 LAB — CALCIUM: Calcium: 8.5

## 2011-05-09 ENCOUNTER — Encounter: Payer: Self-pay | Admitting: Family Medicine

## 2011-05-09 ENCOUNTER — Ambulatory Visit (INDEPENDENT_AMBULATORY_CARE_PROVIDER_SITE_OTHER): Payer: Self-pay | Admitting: Family Medicine

## 2011-05-09 VITALS — BP 138/90 | HR 80 | Temp 98.2°F | Ht 63.75 in | Wt 187.0 lb

## 2011-05-09 DIAGNOSIS — H0019 Chalazion unspecified eye, unspecified eyelid: Secondary | ICD-10-CM

## 2011-05-09 NOTE — Progress Notes (Signed)
  Subjective:    Patient ID: [name], male    DOB: Aug 19, 1975, 36 y.o.   MRN: [id number]  HPI  Pt presents complaining of eye irritation and occasional blurry vision for about 4 months.  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  He says that sometimes there seems to be a film over his eyes.  He says he has had redness in the medial side of both his eyes for years but now there seems to be a "skin" growing from the tear duct towards the middle of his eye.    He denies headaches, nausea/vomiting, dizziness, eye discharge, or eye pain.   Review of Systems Pertinent items in HPI.     Objective:   Physical Exam  Vitals reviewed. Constitutional: He appears well-developed and well-nourished. No distress.  HENT:  Head: Normocephalic and atraumatic.  Eyes:       PERRL, EOMIT, Fundoscopic Exam WNL.   Bilateral Chalazion present from medial tear duct and extends to iris.  Pt has medial conjunctival injection.  There is no drainage.   Vision testing 20/16.            Assessment & Plan:

## 2011-05-09 NOTE — Patient Instructions (Signed)
I am ordering a referral for you to see Opthalmology about your eyes.  Please see the attached sheet for information.  You can try warm compresses on your eye as the sheet instructs.  Also, you can try wearing protective eye wear at work to decrease the irritation.   Estoy ordenando la remisin para que usted vea Oftalmologa sobre sus ojos. Por favor vea la hoja adjunta para informacin. Usted puede tratar compresas tibias en el ojo como dice de instrucciones. Tambin, usted puede intentar usar gafas de proteccin en el trabajo para reducir Youth worker.

## 2011-05-09 NOTE — Assessment & Plan Note (Signed)
Will refer to Optho as this is symptomatic.  Unsure if he will be able to go with the orange card.  Gave pt instructions from up to date in spanish, advised warm compresses and protective eyewear at work.

## 2011-05-15 ENCOUNTER — Telehealth: Payer: Self-pay | Admitting: Family Medicine

## 2011-05-15 NOTE — Telephone Encounter (Signed)
Wants to know about referral to eye doctor.

## 2011-05-18 ENCOUNTER — Telehealth: Payer: Self-pay | Admitting: Family Medicine

## 2011-05-18 NOTE — Telephone Encounter (Signed)
Forward to [employer], please call this patient and explain to him that the referral was sent to project [facility] because he has the orange card and he may not get an appointment if there are no volunteer physicians available. They will send a letter to notify him. If the referral in denied he will have to go to [facility] and do a payment plan, they do not accept the orange card.Bradd Merlos, Rodena Medin

## 2011-05-18 NOTE — Telephone Encounter (Signed)
Patients wife is calling back checking on the referral for the eye doctor.  She said that the whole process was started about 1 1/2 weeks ago and they haven't heard back yet.

## 2011-05-22 ENCOUNTER — Telehealth: Payer: Self-pay | Admitting: Family Medicine

## 2011-05-22 NOTE — Telephone Encounter (Signed)
I spoke with pt's wife and they will wait for some phys called them. [employer]

## 2011-05-22 NOTE — Telephone Encounter (Signed)
I spoke with pt's wife and they will wait some phys call them for future appt.  [employer]

## 2011-06-05 ENCOUNTER — Encounter: Payer: Self-pay | Admitting: Family Medicine

## 2011-06-05 ENCOUNTER — Ambulatory Visit (HOSPITAL_COMMUNITY)
Admission: RE | Admit: 2011-06-05 | Discharge: 2011-06-05 | Disposition: A | Discharge status: Home / Self Care | Payer: Self-pay | Source: Ambulatory Visit | Attending: Family Medicine | Admitting: Family Medicine

## 2011-06-05 ENCOUNTER — Other Ambulatory Visit: Payer: Self-pay

## 2011-06-05 ENCOUNTER — Ambulatory Visit (INDEPENDENT_AMBULATORY_CARE_PROVIDER_SITE_OTHER): Payer: Self-pay | Admitting: Family Medicine

## 2011-06-05 VITALS — BP 119/77 | HR 67 | Ht 63.75 in | Wt 184.0 lb

## 2011-06-05 DIAGNOSIS — R1013 Epigastric pain: Secondary | ICD-10-CM | POA: Insufficient documentation

## 2011-06-05 DIAGNOSIS — R079 Chest pain, unspecified: Secondary | ICD-10-CM

## 2011-06-05 DIAGNOSIS — E890 Postprocedural hypothyroidism: Secondary | ICD-10-CM

## 2011-06-05 DIAGNOSIS — K219 Gastro-esophageal reflux disease without esophagitis: Secondary | ICD-10-CM

## 2011-06-05 LAB — BASIC METABOLIC PANEL
CO2: 27 mEq/L (ref 19–32)
Calcium: 9.8 mg/dL (ref 8.4–10.5)
Potassium: 4.3 mEq/L (ref 3.5–5.3)
Sodium: 138 mEq/L (ref 135–145)

## 2011-06-05 NOTE — Progress Notes (Addendum)
S: Pt comes in today for follow up.   HYPOTHYROIDISM Taking medicines without issues.  + fatigue and nervousness.  Ocasional palpitations. Denies diarrhea, constipation, heat/cold intolerance, weight changes.    EPIGASTRIC DISCOMFORT Dryness in mid-chest, feels like air doesn't pass.  Has been going on for about a year; comes and goes.  No associated nausea, wheezing, SOB.  Restarted PPI 1 week ago to see if it would help (had been off for 3 months), and discomfort seems a little less.  No brought on by food or lying down.  + mouth dryness and hoarseness.  Nothing seems to make it better.  Under a lot of stress at work, feels anxious at times.  Higher stress doesn't necessarily makes symptoms worse.    LEFT SIDED CHEST PAIN Left sided chest discomfort; sarted about 1 month ago, comes and goes.  No pain, just discomfort.  No associated nausea, wheezing, SOB.  Not associated with the epigastric discomfort.  Not associated with movements or lifting.  Pain is achy and crampy.  Occasional with some stabbing.  Feels it every day, has felt it earlier today.  Usually lasts 5 minutes, happens 2-3 times per day.    ROS: Per HPI  History  Smoking status  . Current Some Day Smoker  . Types: Cigarettes  Smokeless tobacco  . Never Used  Comment: smokes very little. some times.    O:  Filed Vitals:   06/05/11 1547  BP: 119/77  Pulse: 67    Gen: NAD HEENT: MMM, no pharyngeal erythema or exudate, no cervical LAD CV: RRR, no murmur Pulm: CTA bilat, no wheezes or crackles Abd: soft, nontender, nondistended, no masses, +BS Ext: Warm, no chronic skin changes, no edema  EKG: NSR, HR 63, no ST changes   A/P: 36 y.o. male p/w hypothyroidism, chest pain, epigastric discomfort  -See problem list -f/u in 1 month

## 2011-06-05 NOTE — Assessment & Plan Note (Signed)
Check TSH today, may need to increase synthroid based on result.

## 2011-06-05 NOTE — Assessment & Plan Note (Signed)
Unclear etiology, may be related to GERD vs anxiety vs allergies (drying out mouth and upper esophagus).  Continue prilosec and monitor.  No dysphagia.

## 2011-06-05 NOTE — Assessment & Plan Note (Addendum)
Non-exertional, atypical, EKG showing NSR.  Check BMET and TSH today.  Will monitor.  Consider FLP for risk stratification at next visit.

## 2011-06-05 NOTE — Patient Instructions (Addendum)
It was great to see you today! We checked an EKG, which shows your heart looks ok. Keep a list of when you have your chest pain and what you are doing when it happens. We are going to recheck your thyroid hormone levels today, since you are back on your medicines and it was not normal in September when we checked it. Keep taking your prilosec to see if this helps the dry feeling you are having.  Come back in about 1 month so we can see how you are doing.  Come back FASTING (nothing to eat or drink) for your next visit.

## 2011-06-06 ENCOUNTER — Telehealth: Payer: Self-pay | Admitting: Family Medicine

## 2011-06-06 ENCOUNTER — Ambulatory Visit: Payer: Self-pay | Admitting: Family Medicine

## 2011-06-06 DIAGNOSIS — N179 Acute kidney failure, unspecified: Secondary | ICD-10-CM

## 2011-06-06 LAB — TSH: TSH: 3.833 u[IU]/mL (ref 0.350–4.500)

## 2011-06-06 NOTE — Telephone Encounter (Signed)
Called patient through interpretation of Dr. Mauricio Po.  Informed patient that his kidney function is slightly elevated (Cr 1.38) but thyroid is normal.  He will return in 2 weeks for repeat BMET.  Advised to avoid NSAIDs (patient states he is not taking any  Medications other than Rx'ed synthroid and prilosec) and remain well hydrated.  If Cr remains elevated, will need further work up.

## 2011-06-15 IMAGING — US US ABDOMEN COMPLETE
1 series · 13 of 25 positions shown · non-contrast
Comparison: None.

CLINICAL DATA: Left upper quadrant pain.

COMPLETE ABDOMINAL ULTRASOUND

[Series 1: us abdomen complete · 0.30mm/px · 13 of 102 slices shown]
[im 1/102]
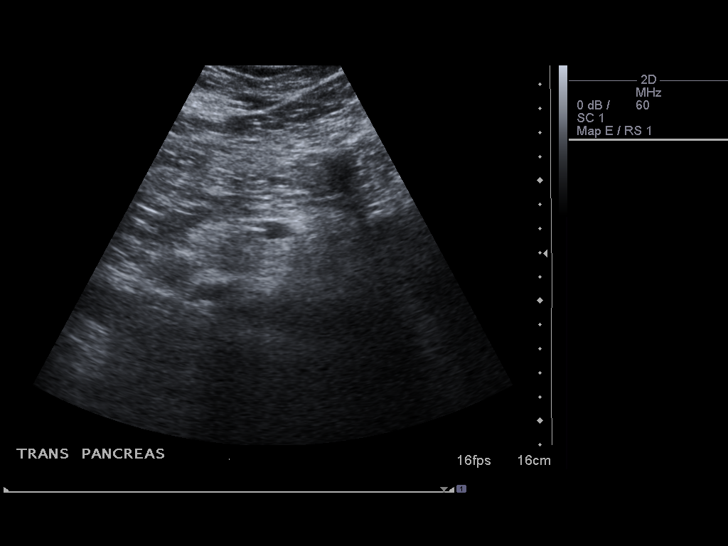
[im 9/102]
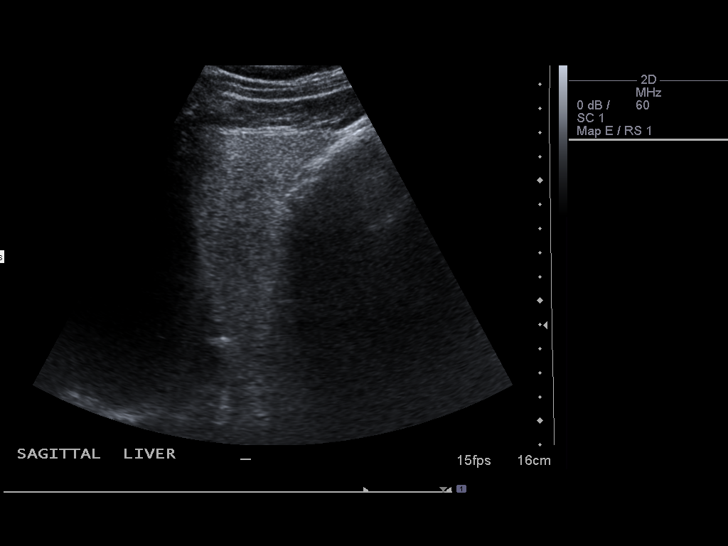
[im 17/102]
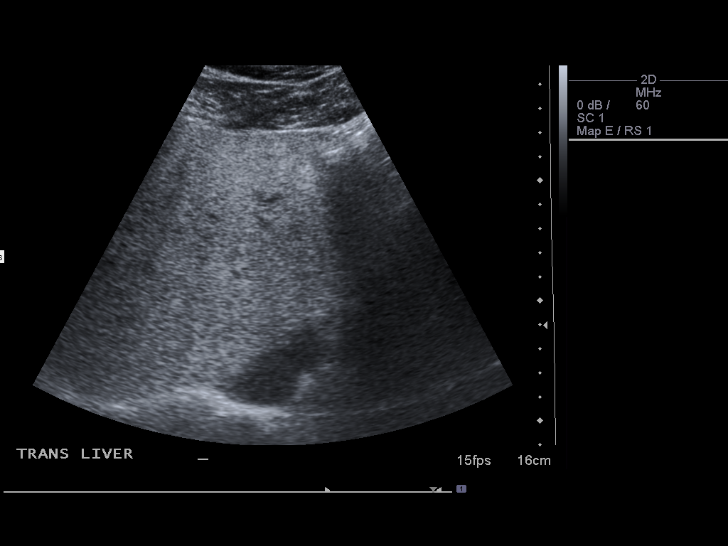
[im 26/102]
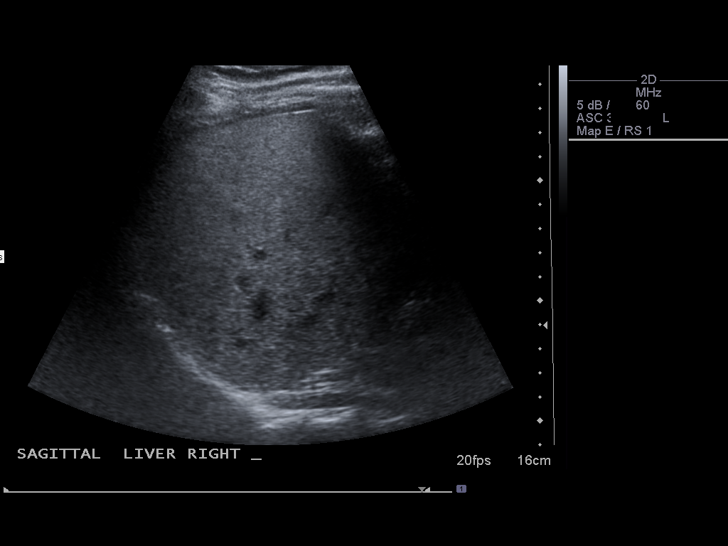
[im 34/102]
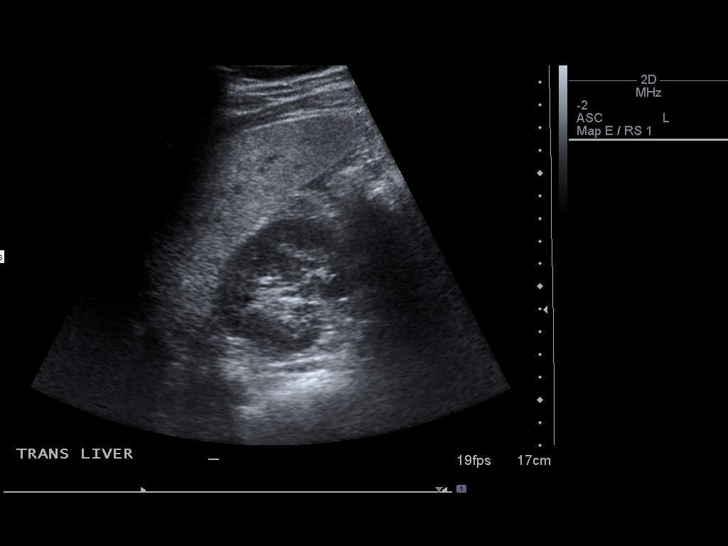
[im 43/102]
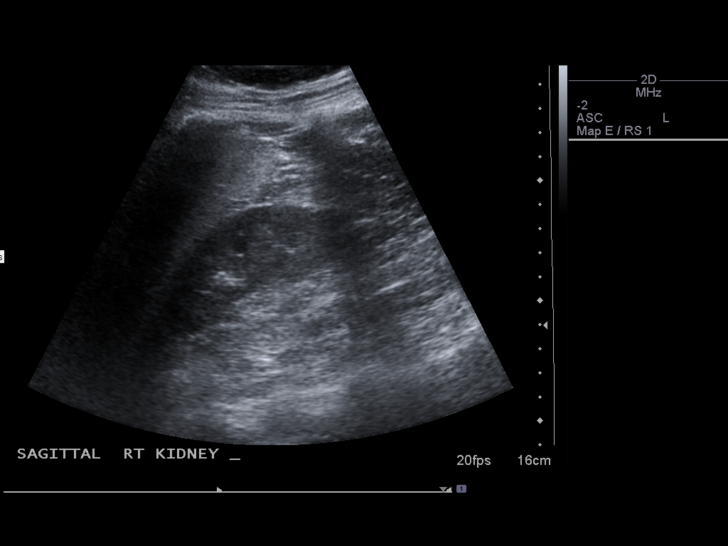
[im 51/102]
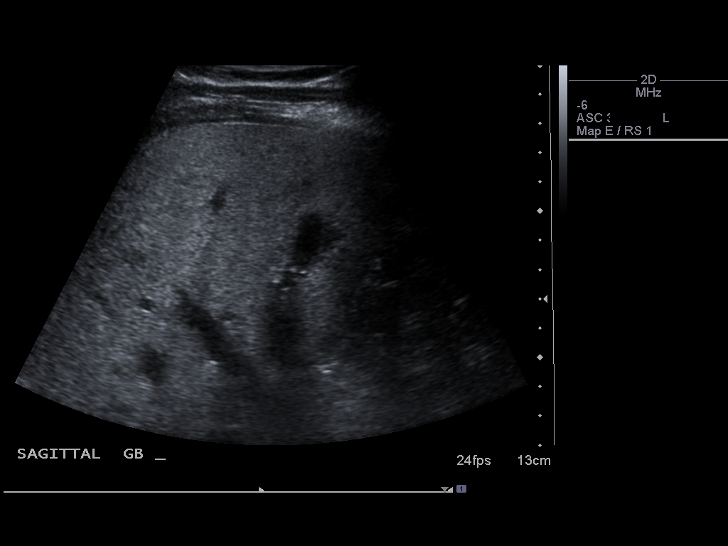
[im 59/102]
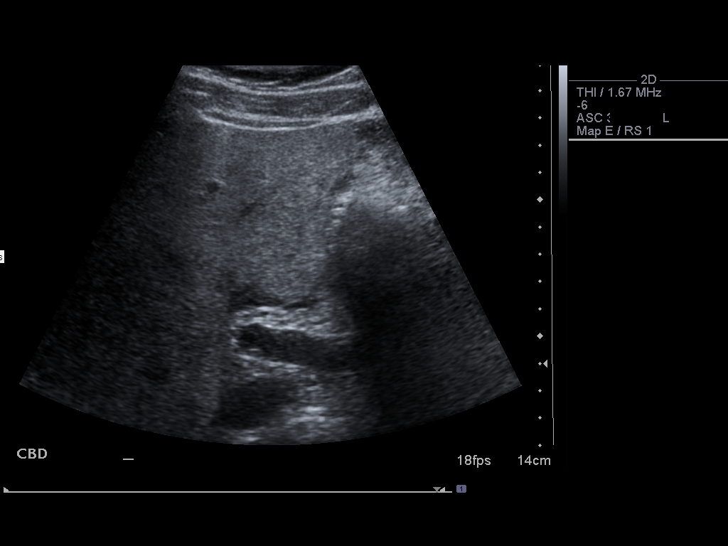
[im 68/102]
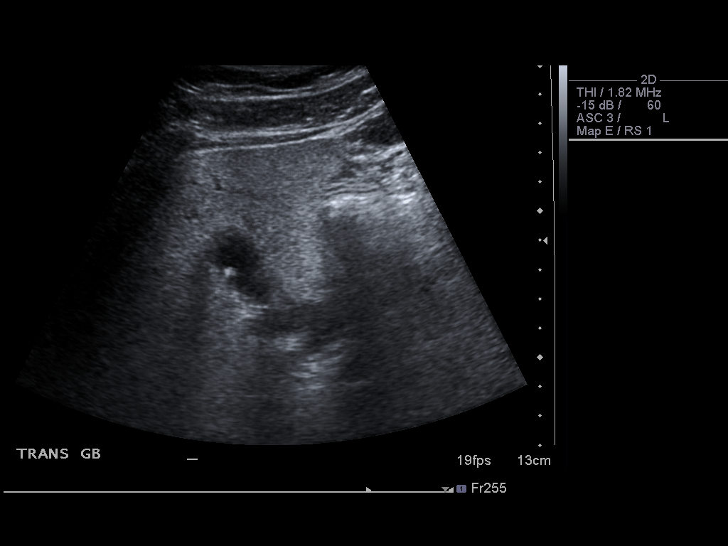
[im 76/102]
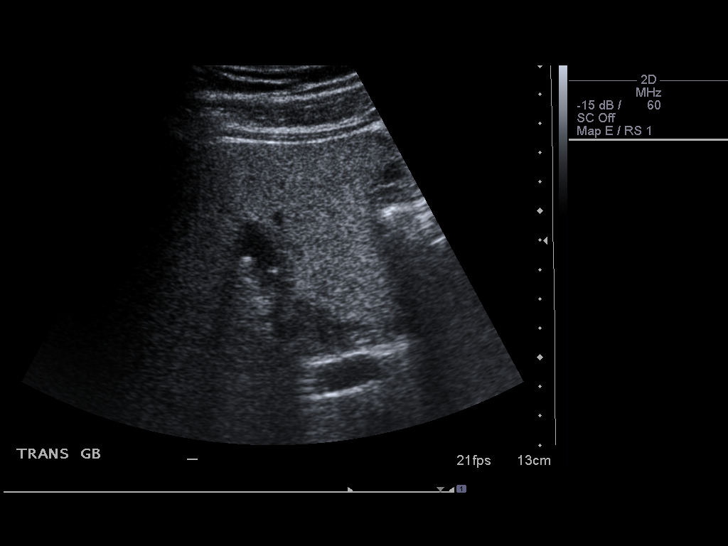
[im 85/102]
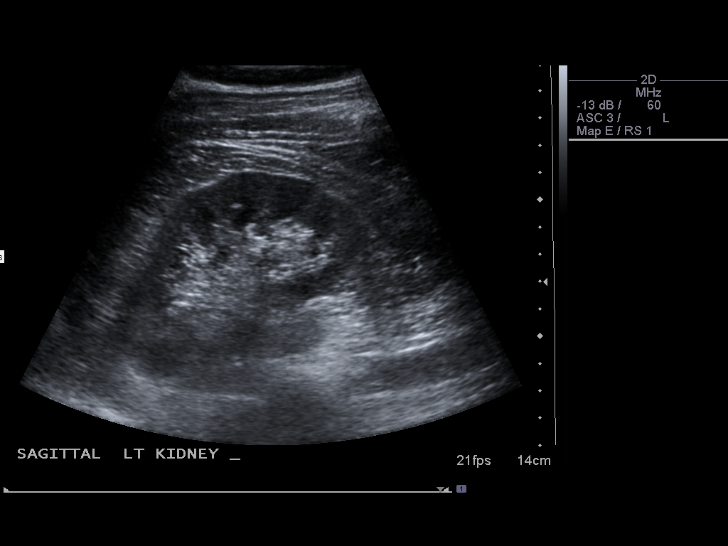
[im 93/102]
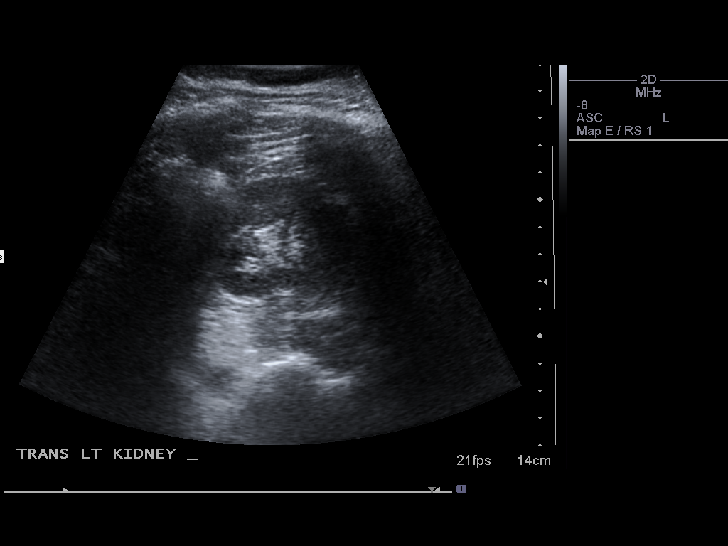
[im 102/102]
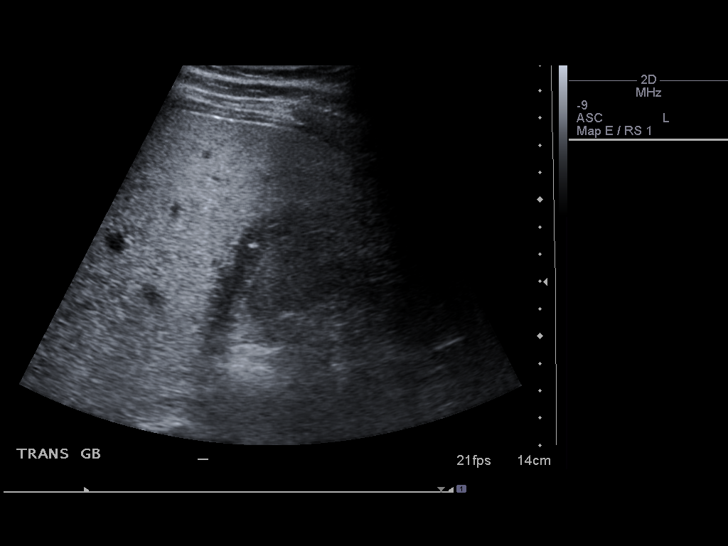

[13 of 25 positions shown; findings below may reference images not displayed]

FINDINGS: Gallbladder:  Two tiny structures measuring up to 4 mm and may
represent small gallstones, polyps or small sludge balls.  No
gallbladder wall thickening or pericholecystic fluid.  The patient
was not tender over this region during scanning.

Common bile duct:  4.1 mm.

Liver:  Diffuse increased echogenicity is slight heterogeneity
suggestive of fatty infiltration without obvious mass.  The
increased echogenicity limits detection of subtle lesion.

IVC:  Not visualized secondary to overlying bowel gas.

Pancreas:  Poorly delineated secondary to overlying bowel gas.

Spleen:  6.4 cm.  No focal lesion.

Right Kidney:  10.8 cm. No hydronephrosis or renal mass.

Left Kidney:  10.3 cm. No hydronephrosis or renal mass.

Abdominal aorta:  Measures up to 2.4 cm.  Distal aspect not well
delineated secondary to overlying bowel gas.
IMPRESSION: Two tiny structures measuring up to 4 mm and may represent small
gallstones, polyps or small sludge balls.  No gallbladder wall
thickening or pericholecystic fluid.  The patient was not tender
over this region during scanning.

Pancreas, inferior vena cava and distal aspect of the aorta not
well visualized secondary to overlying bowel gas.

## 2011-06-19 ENCOUNTER — Other Ambulatory Visit: Payer: Self-pay

## 2011-06-19 DIAGNOSIS — N179 Acute kidney failure, unspecified: Secondary | ICD-10-CM

## 2011-06-19 LAB — BASIC METABOLIC PANEL
CO2: 28 mEq/L (ref 19–32)
Chloride: 102 mEq/L (ref 96–112)
Creat: 0.93 mg/dL (ref 0.50–1.35)
Sodium: 138 mEq/L (ref 135–145)

## 2011-06-19 NOTE — Progress Notes (Signed)
Bmp done today [name] 

## 2011-06-20 ENCOUNTER — Encounter: Payer: Self-pay | Admitting: Family Medicine

## 2011-06-22 ENCOUNTER — Other Ambulatory Visit: Payer: Self-pay | Admitting: Family Medicine

## 2011-06-22 DIAGNOSIS — E890 Postprocedural hypothyroidism: Secondary | ICD-10-CM

## 2011-06-22 MED ORDER — LEVOTHYROXINE SODIUM 150 MCG PO TABS: 150.0000 ug | ORAL_TABLET | Freq: Every day | ORAL | Status: DC

## 2011-08-10 ENCOUNTER — Ambulatory Visit: Payer: Self-pay | Admitting: Family Medicine

## 2011-09-13 ENCOUNTER — Ambulatory Visit: Payer: Self-pay | Admitting: Family Medicine

## 2011-09-19 ENCOUNTER — Ambulatory Visit (HOSPITAL_COMMUNITY)
Admission: RE | Admit: 2011-09-19 | Discharge: 2011-09-19 | Disposition: A | Discharge status: Home / Self Care | Payer: Self-pay | Source: Ambulatory Visit | Attending: Family Medicine | Admitting: Family Medicine

## 2011-09-19 ENCOUNTER — Encounter: Payer: Self-pay | Admitting: Family Medicine

## 2011-09-19 ENCOUNTER — Ambulatory Visit (INDEPENDENT_AMBULATORY_CARE_PROVIDER_SITE_OTHER): Payer: Self-pay | Admitting: Family Medicine

## 2011-09-19 VITALS — BP 144/68 | HR 82 | Ht 63.75 in | Wt 179.0 lb

## 2011-09-19 DIAGNOSIS — K219 Gastro-esophageal reflux disease without esophagitis: Secondary | ICD-10-CM

## 2011-09-19 DIAGNOSIS — R079 Chest pain, unspecified: Secondary | ICD-10-CM | POA: Insufficient documentation

## 2011-09-19 DIAGNOSIS — R030 Elevated blood-pressure reading, without diagnosis of hypertension: Secondary | ICD-10-CM

## 2011-09-19 MED ORDER — OMEPRAZOLE 20 MG PO CPDR: 40.0000 mg | DELAYED_RELEASE_CAPSULE | Freq: Two times a day (BID) | ORAL | Status: DC

## 2011-09-19 NOTE — Progress Notes (Signed)
S: Pt comes in today for follow up.  CHEST PAIN Has been occuring off and on since 04/2011.  Discussed at last appointment-- had normal EKG at that time.  Is getting more frequent and more painful, so he came back.  Is taking his thyroid medicine but no longer taking his prilosec.  Pain is usually sharp, often times has associated throbbing down his left arm.  Usually has 3 episodes per day, each lasting ~1 hour at a time.  Nothing seems to bring them on or make them go away, NOT exertional,  burping sometimes helps.  Sometimes the sharp pain comes after eating, but not always associated with food.  No nausea, vomiting, or diarrhea.    ELEVATED BP First elevated reading today.  Is having chest pain, otherwise no symptoms including no LE edema, SOB, vision changes.   ROS: Per HPI  History  Smoking status  . Former Smoker  . Types: Cigarettes  Smokeless tobacco  . Never Used  Comment: smokes very little. some times.    O:  Filed Vitals:   09/19/11 1551  BP: 156/83  Pulse: 82    Gen: NAD CV: RRR, no murmur, no carotid bruits Pulm: CTA bilat, no wheezes or crackles Abd: soft, NT throughout, +BS, nondistended Ext: Warm, no chronic skin changes, no edema   EKG: NSR, HR 66, early repolarization in V2-V4   A/P: 36 y.o. male p/w likely non-cardiac chest pain (GERD), elevated BP for first time -See problem list -f/u in 1 month

## 2011-09-19 NOTE — Assessment & Plan Note (Signed)
Similar chest pain to 05/2011; still non-exertional, atypical with normal EKG.  BMET and TSH were normal at last check.  Will restart Prilosec and decrease EtOH consumption as this is likely GERD/GI related.  If still having CP in 1 month and has been compliant, would need to consider more intensive cardiac work up.

## 2011-09-19 NOTE — Assessment & Plan Note (Signed)
First elevated reading today, slightly improved on recheck

## 2011-09-19 NOTE — Patient Instructions (Signed)
It was good to see you today.  I do not think that your pain is from your heart-- you EKG and exam look great.  I want you to start taking your prilosec again, but I want you to take 40mg  TWICE a day for the next month.  I also want you to STOP DRINKING ALCOHOL or, at least, cut back to only ONE beer per day.  If you are still having pain in 1 month, we will start a bigger heart work up.  Come back and see me again in 1 month.

## 2011-09-19 NOTE — Assessment & Plan Note (Signed)
Likely related to chest pain.  Will restart prilosec, but at higher dose (40mg  BID). Will also have pt d/c or decrease EtOH to 1 beer per day.  F/u 1 month.

## 2011-10-20 ENCOUNTER — Ambulatory Visit: Payer: Self-pay | Admitting: Family Medicine

## 2011-11-09 ENCOUNTER — Encounter: Payer: Self-pay | Admitting: Family Medicine

## 2011-11-09 ENCOUNTER — Ambulatory Visit (INDEPENDENT_AMBULATORY_CARE_PROVIDER_SITE_OTHER): Payer: Self-pay | Admitting: Family Medicine

## 2011-11-09 VITALS — BP 137/84 | HR 63 | Ht 63.75 in | Wt 177.6 lb

## 2011-11-09 DIAGNOSIS — K219 Gastro-esophageal reflux disease without esophagitis: Secondary | ICD-10-CM

## 2011-11-09 DIAGNOSIS — R079 Chest pain, unspecified: Secondary | ICD-10-CM

## 2011-11-09 DIAGNOSIS — R030 Elevated blood-pressure reading, without diagnosis of hypertension: Secondary | ICD-10-CM

## 2011-11-09 NOTE — Assessment & Plan Note (Signed)
Better with BID prilosec. Will continue.

## 2011-11-09 NOTE — Progress Notes (Signed)
S: Pt comes in today for follow up.     CHEST PAIN Was started on Prilosec at last visit 09/2011 as CP was mostly epigastric, and thought to be more GERD in nature rather than cardiac.  Had normal EKG with this atypical pain.  Also advised to decrease EtOH, which he reports abstinence from.  Since that time, pain has decreased in frequency and severity.  Still will sometimes have sharp, left sided pain, but overall has improved.  Is not every day, only every few days; was previously having 3 episodes per day.  Misses Prilosec at least 1-2 times per week but is usually taking it BID as Rx'ed.    ELEVATED BP W/O DX OF HTN No medications, no previous diagnosis  Symptoms: Headache: No Dizziness: No Vision changes: No SOB:  No Chest pain: Yes : see above LE swelling: No Tobacco use: No   ROS: Per HPI  History  Smoking status  . Former Smoker  . Types: Cigarettes  Smokeless tobacco  . Never Used  Comment: smokes very little. some times.    O:  Filed Vitals:   11/09/11 0847  BP: 137/84  Pulse: 63    Gen: NAD CV: RRR, no murmur Pulm: CTA bilat, no wheezes or crackles Abd: soft, NT- no epigastric tenderness, normoactive BS Ext: Warm, no chronic skin changes, no edema   A/P: 36 y.o. male p/w chest pain, borderline HTN -See problem list -f/u in 1 month

## 2011-11-09 NOTE — Assessment & Plan Note (Signed)
Overall, significantly improved since starting prilosec, making the dx of GERD/Gi related pain more likely.  No need for further cardiac workup in this otherwise healthy young male without significant cardiac family history.  Will monitor BP.

## 2011-11-09 NOTE — Patient Instructions (Addendum)
It was good to see you today. I'm glad your chest pain is doing better. Continue avoiding alcohol and spicy foods. Keep taking your prilosec 2x/day every day. Come back in 1 month.  We will start a blood pressure medicine if you blood pressure is still high.    Dieta para reflujo gastroesofgico o lcera pptica (Diet for GERD or PUD) n cambio en los hbitos nutricionales puede ayudar a Paramedic las molestias del reflujo gastroesofgico y de la lcera pptica.  INSTRUCCIONES PARA EL CUIDADO DOMICILIARIO  Coma lentamente, en un clima distendido.   Coma 5  6 comidas pequeas por da.   Si algn alimento le produce malestar, elimnelo durante algn tiempo.  EVITAR LOS SIGUIENTES ALIMENTOS  Caf, comn o descafeinado.   Bebidas cola, normales o de bajas caloras.   T, comn o descafeinado.   Pimienta.   [place]acao.   Alimentos ricos en grasa, inclusive carnes.   [place], margarina, aceite hidrogenado (grasas trans).   Menta o menta verde (si sufre de reflujo gastroesofgico).   Nils Pyle y verduras cuando haya intolerancia.   Bebidas alcohlicas.   Nicotina (en cigarrillos o para Theatre manager), ya que este es uno de los estimulantes ms potentes en la produccin de cido en el tracto gastrointestinal.   Todo alimento que agrave el trastorno.  Si tiene dudas relacionadas con la dieta, comunquese con el profesional que lo asiste o con un nutricionista matriculado. CONSEJOS  Si se acuesta los sntomas pueden empeorar. Mantenga la cabecera de la cama levantada entre 15 y 23 cm usando una cua de espuma de goma o colocando bloques debajo de las patas de la cama.   No se acueste hasta despus de 3 horas de haber comido.   La actividad fsica diaria puede reducir los sntomas.  EST SEGURO QUE:   Comprende las instrucciones para el alta mdica.   Controlar su enfermedad.   Solicitar atencin mdica de inmediato segn las indicaciones.  Document Released: 12/07/2004 Document  Revised: 02/16/2011 [facility] Patient Information 2012 [place], [place].   Reflujo gastroesofgico - Adultos  (Gastroesophageal Reflux Disease, Adult)  El reflujo gastroesofgico ocurre cuando el cido del estmago pasa al esfago. Cuando el cido entra en contacto con el esfago, el cido provoca dolor (inflamacin) en el esfago. Con el tiempo, pueden formarse pequeos agujeros (lceras) en el revestimiento del esfago. CAUSAS   Exceso de Runner, broadcasting/film/video. Esto aplica presin [employer], lo que hace que el cido del estmago suba hacia el esfago.   El hbito de fumar Aumenta la produccin de cido en el [place].   El consumo de alcohol. Provoca disminucin de la presin en el esfnter esofgico inferior (vlvula o anillo de msculo entre el esfago y Investment banker, corporate), permitiendo que el cido del estmago suba hacia el esfago.   Cenas a ltima hora del da y estmago lleno. Aumenta la presin y la produccin de cido en el estmago.   Malformacin en el esfnter esofgico inferior.  A menudo no se halla causa.  SNTOMAS   Ardor y Radiographer, therapeutic parte inferior del pecho detrs del esternn y en la zona media del [place]. Puede ocurrir [employer] por semana o ms a menudo.   Dificultad para tragar.   Dolor de Advertising copywriter.   Tos seca.   Sntomas similares al asma que incluyen sensacin de opresin en el pecho, falta de aire y sibilancias.  DIAGNSTICO  El mdico diagnosticar el problema basndose en los sntomas. En algunos casos, se indican radiografas y [address]  para verificar si hay complicaciones o para comprobar el estado del [address] y Training and development officer.  TRATAMIENTO  El mdico le indicar medicamentos de venta libre o recetados para ayudar a disminuir la produccin de cido. Consulte con su mdico antes de Corporate investment banker o agregar cualquier medicamento nuevo.  INSTRUCCIONES PARA EL CUIDADO EN EL HOGAR   Modifique los factores que pueda cambiar. Consulte con su mdico para solicitar  orientacin relacionada con la prdida de peso, dejar de fumar y el consumo de alcohol.   Evite las comidas y bebidas que [address] [place], [place]:   [place] con cafena o alcohlicas.   Chocolate.   Sabores a Advertising account planner.   Ajo y cebolla.   Comidas muy condimentadas.   Ctricos como naranjas, limones o limas.   Alimentos que contengan tomate, como salsas, [place] y pizza.   Alimentos fritos y Lexicographer.   Evite acostarse durante 3 horas antes de irse a dormir o antes de tomar una siesta.   Haga comidas pequeas durante Glass blower/designer de 3 comidas abundantes.   Use ropas sueltas. No use nada apretado alrededor de la cintura que cause presin en el estmago.   Levante (eleve) la cabecera de la cama 6 a 8 pulgadas (15 a 20 cm) con bloques de madera. Usar almohadas extra no ayuda.   Solo tome medicamentos que se pueden comprar sin receta o recetados para el dolor, Dentist o fiebre, como le indica el mdico.   No tome aspirina, ibuprofeno ni antiinflamatorios no esteroides.  SOLICITE ATENCIN MDICA DE [employer] SI:   [employer], el cuello, la [place], los dientes o la espalda.   El dolor aumenta o cambia la intensidad o la durancin.   Tiene nuseas, vmitos o sudoracin(diaforesis).   Siente falta de aire o dolor en el pecho, o se desmaya.   Vomita y el vmito tiene [place], es de color [place], [place], negro o es similar a la borra del caf o tiene [place].   Las heces son rojas, sanguinolentas o negras.  Estos sntomas pueden ser signos de [address], como enfermedades cardacas, hemorragias gstrias o sangrado esofgico.  ASEGRESE DE QUE:   Comprende estas instrucciones.   Controlar su enfermedad.   Solicitar ayuda de inmediato si no mejora o si empeora.  Document Released: 12/07/2004 Document Revised: 02/16/2011 [facility] Patient Information 2012 [place], [place].

## 2011-11-09 NOTE — Assessment & Plan Note (Addendum)
Technically, pt not elevated today (<140/90), but borderline elevated.  Discussed with pt and he would like to wait to start a medication.  Consider HCTZ 12.5mg  at next visit in 1 month if still borderline/elevated.

## 2011-12-15 ENCOUNTER — Encounter: Payer: Self-pay | Admitting: Family Medicine

## 2011-12-15 ENCOUNTER — Ambulatory Visit (INDEPENDENT_AMBULATORY_CARE_PROVIDER_SITE_OTHER): Payer: Self-pay | Admitting: Family Medicine

## 2011-12-15 ENCOUNTER — Telehealth: Payer: Self-pay | Admitting: Family Medicine

## 2011-12-15 VITALS — BP 131/84 | HR 66 | Temp 98.0°F | Ht 66.0 in | Wt 179.0 lb

## 2011-12-15 DIAGNOSIS — Z0000 Encounter for general adult medical examination without abnormal findings: Secondary | ICD-10-CM

## 2011-12-15 DIAGNOSIS — N400 Enlarged prostate without lower urinary tract symptoms: Secondary | ICD-10-CM | POA: Insufficient documentation

## 2011-12-15 DIAGNOSIS — R079 Chest pain, unspecified: Secondary | ICD-10-CM

## 2011-12-15 DIAGNOSIS — Z23 Encounter for immunization: Secondary | ICD-10-CM

## 2011-12-15 DIAGNOSIS — R3989 Other symptoms and signs involving the genitourinary system: Secondary | ICD-10-CM

## 2011-12-15 DIAGNOSIS — R39198 Other difficulties with micturition: Secondary | ICD-10-CM

## 2011-12-15 DIAGNOSIS — E663 Overweight: Secondary | ICD-10-CM

## 2011-12-15 LAB — LIPID PANEL
HDL: 64 mg/dL (ref >39)
LDL Cholesterol: 118 mg/dL — ABNORMAL HIGH (ref 0–99)
Total CHOL/HDL Ratio: 3.3 Ratio
Triglycerides: 148 mg/dL (ref <150)
VLDL: 30 mg/dL (ref 0–40)

## 2011-12-15 LAB — COMPREHENSIVE METABOLIC PANEL
ALT: 48 U/L (ref 0–53)
BUN: 18 mg/dL (ref 6–23)
CO2: 27 mEq/L (ref 19–32)
Calcium: 9.8 mg/dL (ref 8.4–10.5)
Chloride: 101 mEq/L (ref 96–112)
Creat: 0.92 mg/dL (ref 0.50–1.35)
Total Bilirubin: 0.6 mg/dL (ref 0.3–1.2)

## 2011-12-15 LAB — POCT URINALYSIS DIPSTICK
Bilirubin, UA: NEGATIVE
Blood, UA: NEGATIVE
Glucose, UA: NEGATIVE
Spec Grav, UA: 1.02

## 2011-12-15 LAB — CBC
HCT: 45.3 % (ref 39.0–52.0)
Hemoglobin: 15.7 g/dL (ref 13.0–17.0)
MCV: 90.4 fL (ref 78.0–100.0)
RDW: 13.8 % (ref 11.5–15.5)
WBC: 7.2 10*3/uL (ref 4.0–10.5)

## 2011-12-15 MED ORDER — TAMSULOSIN HCL 0.4 MG PO CAPS: 0.4000 mg | ORAL_CAPSULE | Freq: Every day | ORAL | Status: DC

## 2011-12-15 NOTE — Assessment & Plan Note (Signed)
Flomax, f/u in 1-2 months to see if symptoms have improved on medication.

## 2011-12-15 NOTE — Patient Instructions (Signed)
It was good to see you.  For your urine problems, I am starting a medicine called flomax.  You have something called BPH.  It is not dangerous or bad.  This medicine should help you urinate better.  For your chest pain, I will talk with the doctor who does stress tests and see if she can schedule you in the next month. It is a LOW probability that this is from your heart.   Come back in 1-2 months so we can see if the medicine is helping your urination.     Hiperplasia prosttica benigna (Benign Prostatic Hyperplasia) El tamao de su prstata est aumentado. Se trata de un problema muy frecuente [employer] varones de [place]. Esto se denomina HPB. Significa hiperplasia prosttica benigna. La glndula prosttica se ubica en la base de la vejiga. Cuando la prstata aumenta de tamao, obstruye la [place]. La uretra es el conducto que drena la orina desde la vejiga.  SNTOMAS   Disminucin del chorro miccional.  La orina sale a pequeos chorros.  Sensacin de que la vejiga no se vaca completamente.  Problemas para comenzar a Geographical information systems officer.  Levantarse a orinar con frecuencia por las noches.  Orinar con ms frecuencia Administrator. Una obstruccin urinaria completa o el dolor intenso requieren atencin inmediata. DIAGNSTICO  El profesional se formar una idea exacta de su problema a travs de la historia clnica y el examen fsico.  Tambin pueden tomarse radiografas especficas. TRATAMIENTO  En los casos leves no ser necesario Psychologist, clinical.  Si el problema es [place], los medicamentos pueden proporcionar alivio. Algunos de estos medicamentos reducen el tamao de la prstata. Es comn el uso del fruto de la palma enana americana para aliviar este problema.  Si se produce un bloqueo completo, le colocarn un catter [place] durante [employer].  En los casos ms graves se tratar con [place]. La RTU de prstata es la ciruga que se realiza a travs de Engineer, mining.  RTU de prstata significa reseccin transuretral. Implica la reseccin de pequeos sectores de la prstata. Se retiran a travs del pene.  Las tcnicas utilizan calor, microondas y rayos lser para remover la obstruccin. INSTRUCCIONES PARA EL CUIDADO DOMICILIARIO  Tmese su tiempo para orinar.  No consuma alcohol.  Las bebidas que contienen cafena, tales como el caf el t y las bebidas cola pueden empeorar el problema.  Tambin se desaconseja el uso de antihistamnicos y algunos medicamentos que pueden empeorar el problema.  Concurra a las consultas de control con el profesional que lo asiste, segn le haya indicado. SOLICITE ATENCIN MDICA DE INMEDIATO SI:  Aumenta el dolor al orinar o no puede eliminar la orina.  Presenta dolor abdominal intenso, vmitos, fiebre o desmayos.  Presenta un dolor sbito e intenso en la espalda u observa sangre en la orina. EST SEGURO QUE:   Comprende las instrucciones para el alta mdica.  Controlar su enfermedad.  Solicitar atencin mdica de inmediato segn las indicaciones. Document Released: 02/27/2005 Document Revised: 05/22/2011 [facility] Patient Information 2013 [place], [place].

## 2011-12-15 NOTE — Assessment & Plan Note (Signed)
Check FLP, CMET, CBC 

## 2011-12-15 NOTE — Telephone Encounter (Signed)
Advised wife of appt  Day and time for ETT per [place]-Oct 18th at 11;30. Wife speaks and reads English, so I will mail fact sheet to her. Voiced understanding.Advised to get to Admitting 10:45AM

## 2011-12-15 NOTE — Progress Notes (Signed)
S: Pt comes in today for general CPE and labs. Changed into a problem visit. Pt's orange card is expiring in ~1 month.  Interview done with the assistance of spanish interpreter.   CHEST PAIN Has been a recurrent problem.  Still taking his prilosec 40 BID, but pain is every day again.  Left sided, constant, 3/10 when it is not bad, increases to 7/10 without warning. 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  When it is bad, pain radiates to his left arm, may also radiate to right side of chest and/or to back. No associated N/V, diaphoresis, or dizziness/vision changes.  Nothing seems to make the pain get better, just eases up on its own. Patient and wife would really like to have this worked up further with a stress test before his orange card runs out.   Does complain of fatigue all of the time; not related to chest pain.    URINARY PROBLEMS Feels like he does not entirely empty his bladder x1 year.  Will urinate and feel like he is finished, put his pants back on, and then feels like he needs to go again.  When he tries to go again, he will have a very small amount, just drops, come out.  This happens every time he urinates.  No dysuria, burning/discomfort with urination, or suprapubic pain. No blood in urine. Does not feel like urine is cloudy.  Does have to get up at night to go to the bathroom 1 time every other night.  Has not noticed fevers or chills.  No penile discharge.   ROS: Per HPI  History  Smoking status  . Former Smoker  . Types: Cigarettes  Smokeless tobacco  . Never Used  Comment: smokes very little. some times.    O:  Filed Vitals:   12/15/11 0849  BP: 131/84  Pulse: 66  Temp: 98 F (36.7 C)    Gen: NAD CV: RRR, no murmur Pulm: CTA bilat, no wheezes or crackles Abd: soft, NT Ext: Warm, no chronic skin  changes, no edema Rectal: enlarged prostate ("shelf")- but smooth and symmetric, nontender    A/P: 36 y.o. male p/w BPH, continued CP -See problem list -f/u in 1-2 months

## 2011-12-15 NOTE — Assessment & Plan Note (Signed)
Continues to be an issue.  Pt and wife would like a stress test done-- d/w Dr. Jennette Kettle who agrees to proceed with treadmill stress test.  Low probability that this is cardiac (<10-20%) but will try to schedule and perform for pt anxiety relief. Of note, pt's orange card runs out in ~1 month, so will try to schedule this sooner rather than later.

## 2011-12-18 ENCOUNTER — Encounter: Payer: Self-pay | Admitting: Family Medicine

## 2011-12-18 ENCOUNTER — Telehealth: Payer: Self-pay | Admitting: Family Medicine

## 2011-12-18 NOTE — Telephone Encounter (Signed)
Message copied by Barnie Alderman on Mon Dec 18, 2011 11:52 AM ------      Message from: Annita Brod      Created: Mon Dec 18, 2011  9:21 AM       Corrie Dandy!!! We had to change the ETT for the 18th bc dr neal is not in the office that day. Call the pt and let him know i changed it to the 25th. i have already call the hospital and informed them.            thx      ----- Message -----         From: Lizbeth Bark         Sent: 12/18/2011   8:16 AM           To: Lillia Pauls, CMA            Dr. Jennette Kettle is not in the office that day.      ----- Message -----         From: Lillia Pauls, CMA         Sent: 12/15/2011   2:25 PM           To: Inez Pilgrim Ceresi            i meant to send this to you, not me. lol      ----- Message -----         From: Lillia Pauls, CMA         Sent: 12/15/2011   2:19 PM           To: Lillia Pauls, CMA            schd this pt for an ETT on 10.18.13  with neal at 1130 please.Marland Kitchen            thx

## 2011-12-18 NOTE — Telephone Encounter (Signed)
Called and explained to pt's wife of the change of date for the ETT. She voiced understanding.

## 2011-12-21 ENCOUNTER — Encounter: Payer: Self-pay | Admitting: Family Medicine

## 2011-12-27 ENCOUNTER — Other Ambulatory Visit: Payer: Self-pay | Admitting: Family Medicine

## 2011-12-27 DIAGNOSIS — E890 Postprocedural hypothyroidism: Secondary | ICD-10-CM

## 2011-12-27 MED ORDER — LEVOTHYROXINE SODIUM 150 MCG PO TABS: 150.0000 ug | ORAL_TABLET | Freq: Every day | ORAL | Status: DC

## 2011-12-27 NOTE — Telephone Encounter (Signed)
1 year supply of synthroid faxed to [facility]

## 2011-12-29 ENCOUNTER — Ambulatory Visit (HOSPITAL_COMMUNITY): Payer: Self-pay

## 2012-01-05 ENCOUNTER — Ambulatory Visit (HOSPITAL_COMMUNITY)
Admission: RE | Admit: 2012-01-05 | Discharge: 2012-01-05 | Disposition: A | Discharge status: Home / Self Care | Payer: Self-pay | Source: Ambulatory Visit | Attending: Family Medicine | Admitting: Family Medicine

## 2012-01-05 ENCOUNTER — Ambulatory Visit (HOSPITAL_BASED_OUTPATIENT_CLINIC_OR_DEPARTMENT_OTHER): Payer: Self-pay | Admitting: Family Medicine

## 2012-01-05 DIAGNOSIS — R079 Chest pain, unspecified: Secondary | ICD-10-CM

## 2012-01-05 NOTE — Progress Notes (Signed)
The Graded Exercise Test was negative for signs of ischemia. The patient achieved and exercise level of  11.4 mets. 85% - 90% Maximal  Heart Rate was : 155  Pretest Likelihood that this patient has coronary artery disease (CAD), given their symptoms and risk profile was estimated to be  Low probability Post test likelihood of this patient having CAD was estimated to be  < 10%.  TEST SPECIFICS: Maximum heart rate achieved  171 Exercise effort  excellent Symptoms with exercise?  none Blood pressure response to exercise?  normal EKG response to exercise: no signs of ischemia  COMMENTS:  I stopped the test at 11.5 METS but he could easily have gone for another stage. Totally asymptomatic. I discussed with him and his wife---he (and she) both seem very anxious even in teh setting of his reassuring test. It makes me wonder if his chest pain is anxiety related.  RECOMMENDATION: f/u with Dr Billey Gosling was interpretor.

## 2012-01-12 ENCOUNTER — Ambulatory Visit (INDEPENDENT_AMBULATORY_CARE_PROVIDER_SITE_OTHER): Payer: Self-pay | Admitting: Family Medicine

## 2012-01-12 ENCOUNTER — Encounter: Payer: Self-pay | Admitting: Family Medicine

## 2012-01-12 ENCOUNTER — Telehealth: Payer: Self-pay | Admitting: Family Medicine

## 2012-01-12 VITALS — BP 138/88 | HR 62 | Ht 64.0 in | Wt 184.0 lb

## 2012-01-12 DIAGNOSIS — K219 Gastro-esophageal reflux disease without esophagitis: Secondary | ICD-10-CM

## 2012-01-12 DIAGNOSIS — R079 Chest pain, unspecified: Secondary | ICD-10-CM

## 2012-01-12 DIAGNOSIS — N400 Enlarged prostate without lower urinary tract symptoms: Secondary | ICD-10-CM

## 2012-01-12 MED ORDER — BUSPIRONE HCL 10 MG PO TABS: 5.0000 mg | ORAL_TABLET | Freq: Three times a day (TID) | ORAL | Status: DC

## 2012-01-12 NOTE — Telephone Encounter (Signed)
**  WILL NEED TO BE CALLED BY SPANISH SPEAKING PERSON** Please call patient and let him know that H pylori was NEGATIVE-- his pain is not being caused by a bacteria in his stomach.  I have sent in a medicine to help with anxiety, which is the most likely cause of his chest pain.  He will take 1/2 tablet 3 times per day.  I want to see him back in a few weeks.  He needs to make sure he meets with Britta Mccreedy ASAP since his orange card ran out.

## 2012-01-12 NOTE — Assessment & Plan Note (Signed)
Stress test negative.  Will check H pylori to ensure that is not causing the pain, otherwise will attribute this to anxiety as all other work ups have been negative for this very atypical chest pain.  UPDATE: H pylori negative.  Will send in buspar to pharmacy- start with 5mg  TID, could increase to 10mg  TID (usual dosing is 20-30mg /day in 2-3 divided doses).

## 2012-01-12 NOTE — Assessment & Plan Note (Signed)
Mild improvement with flomax, has only been taking x2-3 weeks. F/u in 1 month. If still only minimal response, will increase to 0.8mg /day.

## 2012-01-12 NOTE — Telephone Encounter (Signed)
Will forward to [employer].

## 2012-01-12 NOTE — Patient Instructions (Signed)
It was good to see you today.   We are drawing a blood test to see if you have a bacteria called H pylori that is causing your chest pain.  I will call you with the results.  If it is positive, we will start 3 medicines.  If it is negative, we will start a medicine to help with your anxiety.  Come back to see me in about 1 month.  MAKE AN APPOINTMENT WITH BARBARA MCGREGOR TO RENEW YOUR ORANGE CARD!!!    Enfermedad por Helicobacter Pylori y lcera (Helicobacter Pylori and Ulcer Disease) Es posible que exista una lcera en su estmago (lcera gstrica) o en la primera parte del intestino delgado, lo que se denomina el duodeno (lcera duodenal). Una lcera es una ruptura en el recubrimiento del estmago o del duodeno. La ruptura avanza hacia el tejido ms profundo. El Helicobacter pylori (H. Pylori) es un tipo de germen (bacteria) responsable de la mayora de las lceras gstricas o duodenales. CAUSAS  Un germen (bacteria). El H. pylori puede debilitar la mucosa protectora que cubre el estmago y [place]. Esto permite que ingrese cido en el recubrimiento sensible del estmago o duodeno y entonces puede formarse una lcera.   Ciertos medicamentos.   Utilizan sustancias que puedan irritar el recubrimiento del estmago (alcohol, tabaco, o medicamentos tales como Advil o Motrin) en la presencia de una infeccin por H. pylori. Esto puede aumentar las probabilidades de tener una lcera.   Cncer (poco frecuente).  La mayor parte de las personas infectadas con H. pylori no tienen lceras. No se sabe de que [employer] se contagian el H. pylori. Podra ser a travs de alimentos o del agua. El H. pylori se ha hallado en la saliva de algunas personas infectadas. Por lo tanto, es posible que la bacteria tambin se contagie a travs del contacto boca a boca, tal como al besar. SNTOMAS Los problemas (sntomas) de las lceras normalmente son:  Ardor persistente en la parte superior del vientre  (abdomen). A menudo esto empeora si se tiene el estmago vaco. Puede mejorar consumiendo alimentos. Puede estar asociado con sentir ganas de vomitar (nuseas), hinchazn y vmitos.   Si la lcera resulta en sangrado, puede causar:   Materia fecal de color negro alquitranado.   Vmitos de sangre roja brillante   Vmito de una sustancia similar a la borra del caf.  Si la hemorragia es grave, puede haber prdida de la conciencia y shock. Adems de lceras, el H. pylori tambin puede causar gastritis crnica (irritacin del recubrimiento del estmago, sin lcera) o un Programme researcher, broadcasting/film/video cido. Es posible que no tenga sntomas aunque tenga una infeccin por H. pylori. Aunque se trata de una infeccin, es posible que no tenga los sntomas comunes de una infeccin (tal como la [place]). DIAGNSTICO Las lceras se pueden diagnosticar de Environmental consultant. Si tiene [place], es importante que sepa si fue causada o no por H. pylori. El tratamiento para una lcera causada por el H. pylori es diferente al tratamiento para una lcera producida por otras causas. La mejor forma de Engineer, manufacturing H. pylori es obtener tejido directamente de la lcera durante un examen endoscpico.   Neomia Dear endoscopa es un examen en el que se utiliza un endoscopio. Un endoscopio es un tubo fino que [place] y tiene una pequea cmara en el extremo. Es como un telescopio flexible. Se le dan drogas al paciente para qu est ms calmo (sedante). El profesional introduce el endoscopio por la boca,  y lo hace bajar hacia el estmago y [place]. Esto le permite al mdico observar el tejido que cubre el esfago, el [place] y [place].   Si no es necesario realizar una endoscopa, entonces l H. pylori puede detectarse con exmenes de sangre, de materia fecal, o incluso de aliento.  TRATAMIENTO  El tratamiento de una lcera pptica por H. pylori normalmente incluye una combinacin de:   Medicamentos que destruyen grmenes  (antibiticos).   cido-supresores.   Protectores de [employer].   No se recomienda el uso de un solo medicamento para tratar el H. Pylori. La forma ms eficaz de tratar el problema consiste en administrar durante 2 semanas lo que se conoce como terapia triple. Esta incluye el uso de dos antibiticos para Wellsite geologist las bacterias y un supresor de la secrecin de cido, o un protector del revestimiento gstrico. La terapia triple administrada durante dos semanas reduce los sntomas de la [place], destruye las bacterias y previene que se vuelvan a formar lceras en muchos pacientes.   Desafortunadamente, a las Optometrist complicado porque exige tomar hasta 20 pastillas al [employer], los antibiticos que se utilizan para la terapia triple pueden causar leves efectos secundarios. [address], se incluyen nuseas, vmitos, diarrea, heces de color oscuro, sabor metlico, [place], dolores de [place] e infecciones por levaduras en las mujeres. Consulte con el profesional que lo asiste si tiene alguno de [employer].  INSTRUCCIONES PARA EL CUIDADO DOMICILIARIO  Tome los medicamentos segn las indicaciones y por todo el tiempo en que se los hayan prescripto. Comuniquese con profesional que lo asiste si tiene problemas o sufre efectos adversos debido a los medicamentos.   Contine con Brenton Grills y actividades habituales a menos que el profesional que lo asiste le aconseje otra cosa.   Evite el tabaco, el alcohol, y la cafena. El tabaco disminuir la velocidad de curacin.   Evite los medicamentos que puedan ser nocivos. Entre ellos se incluye la aspirina y los AINES como el ibuprofeno y el naproxeno.   Evite aquellos elementos que Naval architect su estado o Forensic scientist.   Hay disponibles muchos medicamentos de venta libre con los que se puede controlar el cido estomacal y otros sntomas. Converse con el profesional que lo asiste antes de utilizarlos. No cambie los medicamentos de  prescripcin por medicamentos de venta libre sin hablarlo con el profesional.   Generalmente no es necesario llevar dietas especiales.   Cumpla con las citas y exmenes de sangre tal como se le indic.  SOLICITE ATENCIN MDICA SI:  El dolor u otros sntomas de la lcera no mejoran luego de [address] de iniciado el [place].   Presenta diarrea. Este problema puede estar relacionado con algunos tratamientos.   Tiene indigestin o acidez gstrica continua an cuando los principales sntomas de la lcera hayan mejorado.   Cree que tiene cualquier efecto secundario de los medicamentos o si no comprende cmo Chemical engineer sus medicamentos correctamente.  SOLICITE ATENCIN MDICA DE INMEDIATO SI: Reece Agar lo siguiente:  Desarrolla una hemorragia rectal con sangre de color rojo brillante.   Tiene deposiciones de color negro alquitranado.   Vomita sangre.   Se siente mareado, dbil, tiene episodios de [place], [place] y siente fro.   Experimenta un dolor abdominal intenso que no puede controlar con los [employer]. No tome medicamentos para el dolor a menos que se lo haya indicado el profesional que lo asiste.  EST SEGURO QUE:   Comprende las instrucciones para el alta mdica.  Controlar su enfermedad.   Solicitar atencin mdica de inmediato segn las indicaciones.  Document Released: 02/27/2005 Document Revised: 02/16/2011 [facility] Patient Information 2012 [place], [place].

## 2012-01-12 NOTE — Progress Notes (Signed)
S: Pt comes in today for follow up.  CHEST PAIN Has been a frequent complaint of patient.  Has always been very atypical.  Have tried PPIs and reassurance, which did not seem to make much of a difference.  Most recently, had an exercise stress test performed by Dr. Jennette Kettle on 01/05/12 which was NEGATIVE (<10% probability of this being cardiac in nature, NO ischemic changes seen).  Most likely this is anxiety/stress related given the vague nature.   Since the stress test, the patient reports that he is still having the pain- sometimes hurts when eating.  Pain is every day, off and on.  Still taking prilosec.  Has not gotten any worse or better.    ORANGE CARD HAS EXPIRED. Has not made appt to renew yet.    DIFFICULTY URINATING Diagnosed with BPH earlier this month based on symptoms and prostate exam.  Started on Flomax at that time (12/15/11).  Patient reports that since starting the medicine, feels like it is a little bit better.   ROS: Per HPI  History  Smoking status  . Former Smoker  . Types: Cigarettes  Smokeless tobacco  . Never Used  Comment: smokes very little. some times.    O:  Filed Vitals:   01/12/12 0900  BP: 138/88  Pulse: 62    Gen: NAD CV: RRR, no murmur Pulm: CTA bilat, no wheezes or crackles Abd: soft, nontender throughout, +BS Ext: Warm, no edema   A/P: 36 y.o. male p/w CP, BPH -See problem list -f/u in 4 weeks

## 2012-01-15 NOTE — Telephone Encounter (Signed)
I spoke with wife and they are aware about Labs result and instruction from doctor.  MJ

## 2012-09-14 ENCOUNTER — Other Ambulatory Visit: Payer: Self-pay | Admitting: Family Medicine

## 2012-09-16 NOTE — Telephone Encounter (Signed)
Patient needs an appointment before next refill

## 2013-01-21 ENCOUNTER — Other Ambulatory Visit: Payer: Self-pay | Admitting: Family Medicine

## 2013-02-08 ENCOUNTER — Other Ambulatory Visit: Payer: Self-pay | Admitting: Family Medicine

## 2013-03-07 ENCOUNTER — Other Ambulatory Visit: Payer: Self-pay | Admitting: Family Medicine

## 2013-03-08 ENCOUNTER — Other Ambulatory Visit: Payer: Self-pay | Admitting: Family Medicine

## 2013-03-08 MED ORDER — LEVOTHYROXINE SODIUM 150 MCG PO TABS: 150.0000 ug | ORAL_TABLET | Freq: Every day | ORAL | Status: DC

## 2013-04-19 ENCOUNTER — Other Ambulatory Visit: Payer: Self-pay | Admitting: Family Medicine

## 2013-06-26 ENCOUNTER — Other Ambulatory Visit: Payer: Self-pay | Admitting: Family Medicine

## 2013-06-26 NOTE — Telephone Encounter (Signed)
Pt has not been seen in 1.5 years. Please call and ask him to schedule an appointment in the next month. No more refills to be provided if not.

## 2013-08-22 ENCOUNTER — Encounter: Payer: Self-pay | Admitting: Family Medicine

## 2013-09-05 ENCOUNTER — Ambulatory Visit (INDEPENDENT_AMBULATORY_CARE_PROVIDER_SITE_OTHER): Payer: Self-pay | Admitting: Family Medicine

## 2013-09-05 ENCOUNTER — Ambulatory Visit (HOSPITAL_COMMUNITY)
Admission: RE | Admit: 2013-09-05 | Discharge: 2013-09-05 | Disposition: A | Discharge status: Home / Self Care | Payer: Self-pay | Source: Ambulatory Visit | Attending: Family Medicine | Admitting: Family Medicine

## 2013-09-05 ENCOUNTER — Other Ambulatory Visit (HOSPITAL_COMMUNITY)
Admission: RE | Admit: 2013-09-05 | Discharge: 2013-09-05 | Disposition: A | Discharge status: Home / Self Care | Payer: Self-pay | Source: Ambulatory Visit | Attending: Family Medicine | Admitting: Family Medicine

## 2013-09-05 ENCOUNTER — Encounter: Payer: Self-pay | Admitting: Family Medicine

## 2013-09-05 VITALS — BP 145/89 | HR 79 | Temp 98.0°F | Wt 184.0 lb

## 2013-09-05 DIAGNOSIS — L603 Nail dystrophy: Secondary | ICD-10-CM

## 2013-09-05 DIAGNOSIS — E039 Hypothyroidism, unspecified: Secondary | ICD-10-CM

## 2013-09-05 DIAGNOSIS — R079 Chest pain, unspecified: Secondary | ICD-10-CM | POA: Insufficient documentation

## 2013-09-05 DIAGNOSIS — Z0000 Encounter for general adult medical examination without abnormal findings: Secondary | ICD-10-CM

## 2013-09-05 DIAGNOSIS — H11009 Unspecified pterygium of unspecified eye: Secondary | ICD-10-CM

## 2013-09-05 DIAGNOSIS — L608 Other nail disorders: Secondary | ICD-10-CM

## 2013-09-05 DIAGNOSIS — Z7251 High risk heterosexual behavior: Secondary | ICD-10-CM

## 2013-09-05 DIAGNOSIS — H11003 Unspecified pterygium of eye, bilateral: Secondary | ICD-10-CM

## 2013-09-05 DIAGNOSIS — Z113 Encounter for screening for infections with a predominantly sexual mode of transmission: Secondary | ICD-10-CM | POA: Insufficient documentation

## 2013-09-05 LAB — CBC
HEMATOCRIT: 41.4 % (ref 39.0–52.0)
Hemoglobin: 14.8 g/dL (ref 13.0–17.0)
MCH: 30.5 pg (ref 26.0–34.0)
MCHC: 35.7 g/dL (ref 30.0–36.0)
MCV: 85.4 fL (ref 78.0–100.0)
PLATELETS: 260 10*3/uL (ref 150–400)
RBC: 4.85 MIL/uL (ref 4.22–5.81)
RDW: 14.5 % (ref 11.5–15.5)
WBC: 9.9 10*3/uL (ref 4.0–10.5)

## 2013-09-05 NOTE — Progress Notes (Signed)
   Subjective:    Patient ID: [name], male    DOB: 08/11/1975, 38 y.o.   MRN: [id number]  HPI  Patient claims to present for an annual exam but had numerous specific complaints, so we addressed those today instead.   1. Chest pain - acute on chronic,3 years duration, occurs almost daily but is NOT hurting right now; left sided, radiating to left shoulder, previous work up included cardiac stress test in 2013 which was normal  Exacerbating factors - It can be worsening by eating and he does not recognize other exacerbating factors. It may be related to stress. 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  Alleviated by no specific activities He has tried omeprazole, which help with reflux. It does not help with chest pain Associated symptoms - No SOB, nausea, vomiting, sweating, no cigarette smoking, no hx of hyperlipidemia   2. Redness/Irritation of Eyes - bilateral, several years in duration, sensation of blocking of the pupil as well as itching, burning and redness; no decrease in vision; pt states that he went to an ophthalmologist for this several years ago and was told that is was the results of dust in the work place (pt works in [employer] representative[employer]). No other remedies were suggested according to the patient. Currently, he wears protective glasses at work, but does not think this helps. He has also tried OTC drops for lubrication and redness which do not help.   3. Labs - Pt and wife requesting labs test for STDs including HIV, RPR, Syphilis and Gonorrhea; They did not give a specific reason as to the origin of their concern; additionally, the request labs for metabolic panel, CBC, and cholesterol   PMH - see individual problems for pertinent PMH   Review of Systems Positive for chest pain, anxiety, irritation and redness of the eyes, intermittent dizziness, toe nail problems Negative for weight loss, vomiting, diarrhea, rashes, dysuria     Objective:   Physical  Exam BP 145/89  Pulse 79  Temp(Src) 98 F (36.7 C) (Oral)  Wt 184 lb (83.462 kg)  Vision screening: 20/20 R, 20/20 L , 20/16 bilat   Gen: young adult Hispanic male, well appearing, pleasant, normal body habitus Eyes: pterygium bilaterally with injected conjunctiva, no drainage or swelling, no chemosis, PERRLA, EOMI;  CV: rrr, no murmurs, 2+ peripheral pulses Chest wall: very mild TTP along sternum Left shoulder: 5/5 strength, normal ROM, negative impingement tests, negative empty can Abd: soft, NDNT Toenails: dystrophic thickened and brittle on right great toe        Assessment & Plan:

## 2013-09-05 NOTE — Patient Instructions (Addendum)
[name],   It was nice to meet you. We will check your blood labs today and I will let you know the results. Please call the radiology department to see if you can get the X-ray.   Follow up in one month with your next doctor.   Sincerely,   Dr. Clinton SawyerWilliamson

## 2013-09-06 LAB — LIPID PANEL
CHOL/HDL RATIO: 4.6 ratio
CHOLESTEROL: 256 mg/dL — AB (ref 0–200)
HDL: 56 mg/dL (ref >39)
LDL Cholesterol: 150 mg/dL — ABNORMAL HIGH (ref 0–99)
Triglycerides: 252 mg/dL — ABNORMAL HIGH (ref <150)
VLDL: 50 mg/dL — ABNORMAL HIGH (ref 0–40)

## 2013-09-06 LAB — COMPREHENSIVE METABOLIC PANEL
ALT: 79 U/L — ABNORMAL HIGH (ref 0–53)
AST: 51 U/L — ABNORMAL HIGH (ref 0–37)
Albumin: 4.9 g/dL (ref 3.5–5.2)
Alkaline Phosphatase: 75 U/L (ref 39–117)
BUN: 25 mg/dL — AB (ref 6–23)
CALCIUM: 9.5 mg/dL (ref 8.4–10.5)
CHLORIDE: 104 meq/L (ref 96–112)
CO2: 26 mEq/L (ref 19–32)
Creat: 1.08 mg/dL (ref 0.50–1.35)
GLUCOSE: 96 mg/dL (ref 70–99)
Potassium: 3.9 mEq/L (ref 3.5–5.3)
Sodium: 141 mEq/L (ref 135–145)
Total Bilirubin: 0.6 mg/dL (ref 0.2–1.2)
Total Protein: 7.5 g/dL (ref 6.0–8.3)

## 2013-09-06 LAB — HIV ANTIBODY (ROUTINE TESTING W REFLEX): HIV 1&2 Ab, 4th Generation: NONREACTIVE

## 2013-09-06 LAB — RPR

## 2013-09-06 LAB — TSH: TSH: 11.143 u[IU]/mL — ABNORMAL HIGH (ref 0.350–4.500)

## 2013-09-08 ENCOUNTER — Encounter: Payer: Self-pay | Admitting: Family Medicine

## 2013-09-08 ENCOUNTER — Telehealth: Payer: Self-pay | Admitting: Family Medicine

## 2013-09-08 DIAGNOSIS — H11003 Unspecified pterygium of eye, bilateral: Secondary | ICD-10-CM | POA: Insufficient documentation

## 2013-09-08 DIAGNOSIS — L603 Nail dystrophy: Secondary | ICD-10-CM | POA: Insufficient documentation

## 2013-09-08 NOTE — Assessment & Plan Note (Signed)
Obtain screening labs CBC, CMET, lipids to be discussed at upcoming wellness visit with new provider

## 2013-09-08 NOTE — Assessment & Plan Note (Signed)
I explained the condition and treatment plans. I recommended follow up with an ophthalmologist, and they said that they would return to the MD who sees other members of their family. I am concerned that they are going to see an optometrist, so I suggested an eye surgeon. Otherwise, continue protective equipment and lubricating drops.

## 2013-09-08 NOTE — Telephone Encounter (Signed)
Attempted to call the patient to discuss lab results. He has numerous results and should schedule a follow up visit with his new doctor to discuss these. I will send a letter also.

## 2013-09-08 NOTE — Assessment & Plan Note (Signed)
A very atypical with low likelihood of cardiac etiology and no findings on exam to suggest pulmonary or MSK, so most likely anxiety P:  - obtain 2 view CXR since this has never been done - obtain lipid profile to assess cardiac risk

## 2013-09-08 NOTE — Assessment & Plan Note (Signed)
A: appearance consistent with onychomycosis, but I did not have time to take samples to evaluated P:  - check LFT's today in anticipation of pt need systemic anti-fungal in the future is deemed appropriate by next physician

## 2013-09-15 ENCOUNTER — Telehealth: Payer: Self-pay | Admitting: Family Medicine

## 2013-09-15 NOTE — Telephone Encounter (Signed)
Pt should continue current synthroid dose until his next appt when we will discuss the possibility of adjusting it.  Thanks!

## 2013-09-15 NOTE — Telephone Encounter (Signed)
Dr. Williamson haClinton Sawyerd told patient that he may need to increase Synthroid dosage. Wife calls to verify whether or not this will need to be done before picking up refill of meds at pharmacy. Please call Gema at [phone].

## 2013-09-16 ENCOUNTER — Telehealth: Payer: Self-pay | Admitting: Family Medicine

## 2013-09-16 NOTE — Telephone Encounter (Signed)
Wife called because she needs to talk to Dr. Richarda BladeAdamo concerning her husband medication. He has been out since Sunday but she is worried that if a refill is sent in for old dosage and it changes when he has his appointment it might change and they would rather just pick up one time. Please call [phone]

## 2013-09-17 ENCOUNTER — Other Ambulatory Visit: Payer: Self-pay | Admitting: Family Medicine

## 2013-09-17 NOTE — Telephone Encounter (Signed)
Patient informed. 

## 2013-09-17 NOTE — Telephone Encounter (Signed)
Informed of previous message in telephone note below.

## 2013-09-18 NOTE — Telephone Encounter (Signed)
Has been out of medicine since Sunday. Has been calling about this for 3 days Needs the refill Please advise

## 2013-09-25 ENCOUNTER — Ambulatory Visit: Payer: Self-pay | Admitting: Family Medicine

## 2013-09-29 ENCOUNTER — Ambulatory Visit (INDEPENDENT_AMBULATORY_CARE_PROVIDER_SITE_OTHER): Payer: Self-pay | Admitting: Family Medicine

## 2013-09-29 ENCOUNTER — Encounter: Payer: Self-pay | Admitting: Family Medicine

## 2013-09-29 VITALS — BP 136/84 | HR 73 | Temp 98.1°F | Wt 186.0 lb

## 2013-09-29 DIAGNOSIS — R945 Abnormal results of liver function studies: Principal | ICD-10-CM

## 2013-09-29 DIAGNOSIS — R7989 Other specified abnormal findings of blood chemistry: Secondary | ICD-10-CM | POA: Insufficient documentation

## 2013-09-29 DIAGNOSIS — E7800 Pure hypercholesterolemia, unspecified: Secondary | ICD-10-CM

## 2013-09-29 DIAGNOSIS — E890 Postprocedural hypothyroidism: Secondary | ICD-10-CM

## 2013-09-29 MED ORDER — LEVOTHYROXINE SODIUM 175 MCG PO TABS
175.0000 ug | ORAL_TABLET | Freq: Every day | ORAL | Status: DC
Start: 2013-09-29 — End: 2014-02-09

## 2013-09-29 MED ORDER — PRAVASTATIN SODIUM 40 MG PO TABS: 40.0000 mg | ORAL_TABLET | Freq: Every day | ORAL | Status: DC

## 2013-09-29 NOTE — Patient Instructions (Addendum)
For your thyroid I have increased your medication dose.  I think the inflammation in your liver is from drinking alcohol but today we should check for hepatitis to make sure that is not a cause.  I am also starting a medication for your cholesterol.  Please come back to see me in 1-2 months to recheck your thyroid level.  Thank you!

## 2013-09-30 ENCOUNTER — Telehealth: Payer: Self-pay | Admitting: *Deleted

## 2013-09-30 LAB — ACUTE HEP PANEL AND HEP B SURFACE AB
HCV Ab: NEGATIVE
HEP B C IGM: NONREACTIVE
HEP B S AG: NEGATIVE
Hep A IgM: NONREACTIVE
Hep B S Ab: NEGATIVE

## 2013-09-30 NOTE — Telephone Encounter (Signed)
LM with sone for patient to call office back

## 2013-09-30 NOTE — Telephone Encounter (Signed)
Message copied by Farrell OursEVANS, Ibeth Fahmy K on Tue Sep 30, 2013 11:56 AM ------      Message from: Abram SanderADAMO, ELENA M      Created: Tue Sep 30, 2013 10:49 AM       Please inform patient that his hepatitis tests were negative. He should cut down on his drinking to help his liver, as we talked about. Thanks! ------

## 2013-09-30 NOTE — Assessment & Plan Note (Signed)
Start pravachol

## 2013-09-30 NOTE — Progress Notes (Signed)
   Subjective:    Patient ID: [name], male    DOB: 05/31/1975, 38 y.o.   MRN: [id number]  HPI Pt presents to f/u lab results. Feeling fine but nervous about his liver.   Review of Systems  All other systems reviewed and are negative.      Objective:   Physical Exam  Nursing note and vitals reviewed. Constitutional: He is oriented to person, place, and time. He appears well-developed and well-nourished. No distress.  HENT:  Head: Normocephalic and atraumatic.  Eyes: Conjunctivae are normal. Right eye exhibits no discharge. Left eye exhibits no discharge. No scleral icterus.  Neck: Normal range of motion. Neck supple. No thyromegaly present.  Cardiovascular: Normal rate, regular rhythm and normal heart sounds.   No murmur heard. Pulmonary/Chest: Effort normal and breath sounds normal.  Abdominal: Soft. He exhibits no distension. There is no tenderness.  Musculoskeletal: He exhibits no edema.  Lymphadenopathy:    He has no cervical adenopathy.  Neurological: He is alert and oriented to person, place, and time.  Skin: Skin is warm and dry. He is not diaphoretic.  Psychiatric: He has a normal mood and affect. His behavior is normal.          Assessment & Plan:

## 2013-09-30 NOTE — Assessment & Plan Note (Signed)
Likely alcoholic, pt endorses 3-5 beers daily, no h/o iv drug use, rarely uses tylenol, family history of thyroid disease but no other autoimmune - check hep panel - counseled pt about cutting back on alcohol

## 2013-12-12 ENCOUNTER — Ambulatory Visit: Payer: Self-pay

## 2014-02-04 ENCOUNTER — Ambulatory Visit (INDEPENDENT_AMBULATORY_CARE_PROVIDER_SITE_OTHER): Payer: Self-pay | Admitting: Family Medicine

## 2014-02-04 ENCOUNTER — Ambulatory Visit (INDEPENDENT_AMBULATORY_CARE_PROVIDER_SITE_OTHER): Payer: Self-pay | Admitting: *Deleted

## 2014-02-04 ENCOUNTER — Encounter: Payer: Self-pay | Admitting: Family Medicine

## 2014-02-04 VITALS — BP 126/81 | HR 70 | Temp 97.9°F | Ht 64.0 in | Wt 190.0 lb

## 2014-02-04 DIAGNOSIS — Z23 Encounter for immunization: Secondary | ICD-10-CM

## 2014-02-04 DIAGNOSIS — E890 Postprocedural hypothyroidism: Secondary | ICD-10-CM

## 2014-02-04 DIAGNOSIS — F1010 Alcohol abuse, uncomplicated: Secondary | ICD-10-CM

## 2014-02-04 LAB — TSH: TSH: 19.385 u[IU]/mL — ABNORMAL HIGH (ref 0.350–4.500)

## 2014-02-04 NOTE — Assessment & Plan Note (Signed)
Patient has decreased alcohol from ~20 drinks/week to 5/week - positive reinforcement and encouragement

## 2014-02-04 NOTE — Progress Notes (Signed)
   Subjective:    Patient ID: Rolm BaptiseJose Rodriguez-Segundo, male    DOB: 02/07/1976, 38 y.o.   MRN: [id number]  HPI Hypothyroidism Rolm BaptiseJose Rodriguez-Segundo is a 38 y.o. male who presents for follow up of hypothyroidism. Current symptoms: none . Patient denies change in energy level, diarrhea, heat / cold intolerance, nervousness, palpitations and weight changes. Symptoms have been well-controlled.  In addition, patient reports he has reduced his alcohol use to about 5 drinks per week and is doing well with this, with occasional slips on the weekends according to his wife.   Review of Systems See HPI    Objective:   Physical Exam  Constitutional: He is oriented to person, place, and time. He appears well-developed and well-nourished. No distress.  HENT:  Head: Normocephalic and atraumatic.  Neck: No thyromegaly present.  Cardiovascular: Normal rate, regular rhythm and normal heart sounds.  Exam reveals no gallop and no friction rub.   No murmur heard. Pulmonary/Chest: Effort normal and breath sounds normal. No respiratory distress. He has no wheezes. He has no rales.  Abdominal: Soft. He exhibits no distension and no mass. There is no tenderness. There is no rebound and no guarding.  Lymphadenopathy:    He has no cervical adenopathy.  Neurological: He is alert and oriented to person, place, and time.  Skin: Skin is warm and dry. No rash noted. He is not diaphoretic. No pallor.  Psychiatric: He has a normal mood and affect. His behavior is normal.  Nursing note and vitals reviewed.         Assessment & Plan:

## 2014-02-04 NOTE — Assessment & Plan Note (Signed)
Synthroid dose increased to 175 in July after TSH of 11.143 - recheck TSH today and call patient with any adjustments

## 2014-02-04 NOTE — Progress Notes (Signed)
[name] was the spanish interpreter used during the work up. Blount, Deseree CMA

## 2014-02-04 NOTE — Patient Instructions (Signed)
Llamos con los resultados del laboratorio y para decirle si tenemos que cambiar su dosis otra vez.  Por favor, regresa en 3 meses mas y hacemos las pruebas del higado y cholesterol en esta visita.

## 2014-02-09 MED ORDER — LEVOTHYROXINE SODIUM 200 MCG PO TABS: 200.0000 ug | ORAL_TABLET | Freq: Every day | ORAL | Status: DC

## 2014-02-09 NOTE — Addendum Note (Signed)
Addended by: Abram SanderADAMO, ELENA M on: 02/09/2014 08:27 AM   Modules accepted: Orders

## 2014-02-10 ENCOUNTER — Telehealth: Payer: Self-pay | Admitting: *Deleted

## 2014-02-10 NOTE — Telephone Encounter (Signed)
Pt informed using spanish interpreter rodrigo [id number]. Blount, Deseree CMA

## 2014-02-10 NOTE — Telephone Encounter (Signed)
-----   Message from Abram SanderElena M Adamo, MD sent at 02/09/2014  8:26 AM EST ----- Please call patient (you will need to use language line) and inform him that his thyroid is still low and I am increasing his synthroid dose again. I have called the new dose in to his pharmacy. I would like to see him again in 3 months to recheck. Thanks!

## 2014-07-02 ENCOUNTER — Telehealth: Payer: Self-pay | Admitting: Family Medicine

## 2014-07-02 ENCOUNTER — Other Ambulatory Visit: Payer: Self-pay | Admitting: Family Medicine

## 2014-07-02 DIAGNOSIS — E039 Hypothyroidism, unspecified: Secondary | ICD-10-CM

## 2014-07-02 NOTE — Telephone Encounter (Signed)
Please let the patient know that he needs to be seen to have his thyroid level rechecked since it was not controlled the last time. I am sending in a refill now but I need to see him back by the end of may.

## 2014-07-02 NOTE — Telephone Encounter (Signed)
Calling about pt's refill request. I informed pt that there is a 48hr turn around time for medication refill requests. She is only concerned because her husband has been out of levothyroxine (SYNTHROID, LEVOTHROID) since Sunday. She states that she asked the pharmacy to send it on Monday. Please call pt when ready. Thanks Meryl Dare/Sadie Reynolds, ASA

## 2014-07-03 NOTE — Telephone Encounter (Signed)
Patient has appointment scheduled for 4/26.

## 2014-07-07 ENCOUNTER — Encounter: Payer: Self-pay | Admitting: Family Medicine

## 2014-07-07 ENCOUNTER — Ambulatory Visit (INDEPENDENT_AMBULATORY_CARE_PROVIDER_SITE_OTHER): Payer: Self-pay | Admitting: Family Medicine

## 2014-07-07 VITALS — BP 147/79 | HR 64 | Temp 97.7°F | Ht 64.0 in | Wt 171.7 lb

## 2014-07-07 DIAGNOSIS — E7800 Pure hypercholesterolemia, unspecified: Secondary | ICD-10-CM

## 2014-07-07 DIAGNOSIS — E039 Hypothyroidism, unspecified: Secondary | ICD-10-CM

## 2014-07-07 DIAGNOSIS — R945 Abnormal results of liver function studies: Secondary | ICD-10-CM

## 2014-07-07 DIAGNOSIS — E890 Postprocedural hypothyroidism: Secondary | ICD-10-CM

## 2014-07-07 DIAGNOSIS — R7989 Other specified abnormal findings of blood chemistry: Secondary | ICD-10-CM

## 2014-07-07 MED ORDER — LEVOTHYROXINE SODIUM 200 MCG PO TABS: 200.0000 ug | ORAL_TABLET | Freq: Every day | ORAL | Status: DC

## 2014-07-07 NOTE — Assessment & Plan Note (Signed)
Patient has cut back on alcohol and lost weight - positive reinforcement given, continue these efforts - recheck CMP

## 2014-07-07 NOTE — Assessment & Plan Note (Addendum)
Asymptomatic, recently went a week without meds because he ran out - refilled synthroid at current dose (200) - f/u in 6 weeks to recheck TSH and make adjustments as needed

## 2014-07-07 NOTE — Patient Instructions (Signed)
Opciones de alimentos para bajar el nivel de triglicridos (Food Choices to Lower Your Triglycerides) Los triglicridos son un tipo de grasas que se encuentran en la sangre. Un nivel elevado de triglicridos puede aumentar el riesgo de padecer enfermedades cardacas e infartos. Si sus niveles de triglicridos son altos, los alimentos que se ingieren y los hbitos de alimentacin son muy importantes. Elegir los alimentos adecuados puede ayudar a bajar el nivel de triglicridos.  QU PAUTAS GENERALES DEBO SEGUIR?  Baje de peso si es necesario.  Limite o evite el alcohol.  Llene la mitad del plato con vegetales y ensaladas de hojas verdes.  Limite las frutas a dos porciones por da. Elija frutas en lugar de jugos.  Ocupe un cuarto del plato con cereales integrales. Busque la palabra "integral" en el primer lugar de la lista de ingredientes.  Llene un cuarto del plato con alimentos con protenas magras.  Disfrute de pescados grasos (como salmn, caballa, sardinas y atn) tres veces por semana.  Elija las grasas saludables.  Limite los alimentos con alto contenido de almidn y azcar.  Consuma ms comida casera y menos de restaurante, de buf y comida rpida.  Limite el consumo de alimentos fritos.  Cocine los alimentos utilizando mtodos que no sean la fritura.  Limite el consumo de grasas saturadas.  Verifique las listas de ingredientes para evitar alimentos con aceites parcialmente hidrogenados (grasas trans). QU ALIMENTOS PUEDO COMER?  Cereales Cereales integrales, como los panes de salvado o integrales, las galletas, los cereales y las pastas. Avena sin endulzar, trigo, cebada, quinua o arroz integral. Tortillas de harina de maz o de salvado.  Vegetales Verduras frescas o congeladas (crudas, al vapor, asadas o grilladas). Ensaladas de hojas verdes. Fruits Frutas frescas, en conserva (en su jugo natural) o frutas congeladas. Carnes y otros productos con protenas Carne de  res molida (al 85% o ms magra), carne de res de animales alimentados con pastos o carne de res sin la grasa. Pollo o pavo sin piel. Carne de pollo o de pavo molida. Cerdo sin la grasa. Todos los pescados y frutos de mar. Huevos. Porotos, guisantes o lentejas secos. Frutos secos o semillas sin sal. Frijoles secos o en lata sin sal. Lcteos Productos lcteos con bajo contenido de grasas, como leche descremada o al 1%, quesos reducidos en grasas o al 2%, ricota con bajo contenido de grasas o queso cottage, o yogur natural con bajo contenido de grasas. Grasas y aceites Margarinas en barra que no contengan grasas trans. Mayonesa y condimentos para ensaladas livianos o reducidos en grasas. [place]. Aceites de crtamo, oliva o canola. Mantequilla natural de man o almendra. Los artculos mencionados arriba pueden no ser una lista completa de las bebidas o los alimentos recomendados. Comunquese con el nutricionista para conocer ms opciones. QU ALIMENTOS NO SE RECOMIENDAN?  Cereales Pan blanco. Pastas blancas. Arroz blanco. Pan de maz. Bagels, pasteles y croissants. Galletas saladas que contengan grasas trans. Vegetales Papas blancas. Maz. Vegetales con crema o fritos. Verduras en salsa de queso. Fruits Frutas secas. Fruta enlatada en almbar liviano o espeso. Jugo de frutas. Carnes y otros productos con protenas Cortes de carne con grasa. Costillas, alas de pollo, tocineta, salchicha, mortadela, salame, chinchulines, tocino, perros calientes, salchichas alemanas y embutidos envasados. Lcteos Leche entera o al 2%, crema, mezcla de leche y crema y queso crema. Yogur entero o endulzado. Quesos con toda su grasa. Cremas no lcteas y coberturas batidas. Quesos procesados, quesos para untar o cuajadas. Dulces y postres Jarabe de   maz, azcares, miel y melazas. Caramelos. Mermelada y jalea. Jarabe. Cereales endulzados. Galletas, pasteles, bizcochuelos, donas, muffins y helado. Grasas y  aceites Mantequilla, margarina en barra, manteca de cerdo, grasa, mantequilla clarificada o grasa de tocino. Aceites de coco, de palmiste o de palma. Bebidas Alcohol. Bebidas endulzadas (como refrescos, limonadas y bebidas frutales o ponches). Los artculos mencionados arriba pueden no ser una lista completa de las bebidas y los alimentos que se deben evitar. Comunquese con el nutricionista para recibir ms informacin. Document Released: 08/17/2009 Document Revised: 03/04/2013 ExitCare Patient Information 2015 ExitCare[employer]. This information is not intended to replace advice given to you by your health care provider. Make sure you discuss any questions you have with your health care provider.  

## 2014-07-07 NOTE — Progress Notes (Signed)
   Subjective:    Patient ID: Rolm BaptiseJose Rodriguez-Segundo, male    DOB: 06/29/1975, 39 y.o.   MRN: [id number]  HPI Pt presents for f/u of hypothyroidism. He reports no skin or hair changes, normal BMs not heat or cold intolerance. He had been taking his medicine daily but recently ran out and was off for a week before restarting yesterday.    Review of Systems See HPI    Objective:   Physical Exam  Constitutional: He is oriented to person, place, and time. He appears well-developed and well-nourished. No distress.  HENT:  Head: Normocephalic and atraumatic.  Cardiovascular: Normal rate, regular rhythm and normal heart sounds.   No murmur heard. Pulmonary/Chest: Effort normal and breath sounds normal. No respiratory distress. He has no wheezes.  Abdominal: Soft. Bowel sounds are normal. He exhibits no distension and no mass. There is no rebound and no guarding.  Mild lower abdominal tenderness  Neurological: He is alert and oriented to person, place, and time.  Skin: Skin is warm and dry. No rash noted. He is not diaphoretic.  Psychiatric: He has a normal mood and affect. His behavior is normal.  Nursing note and vitals reviewed.         Assessment & Plan:

## 2014-07-07 NOTE — Assessment & Plan Note (Signed)
Patient stopped pravastatin because it was expensive and he has lost weight - will recheck lipid panel today and discuss whether it is necessary to restart

## 2014-07-08 LAB — COMPREHENSIVE METABOLIC PANEL
ALT: 40 U/L (ref 0–53)
AST: 28 U/L (ref 0–37)
Albumin: 4.3 g/dL (ref 3.5–5.2)
Alkaline Phosphatase: 78 U/L (ref 39–117)
BILIRUBIN TOTAL: 0.4 mg/dL (ref 0.2–1.2)
BUN: 15 mg/dL (ref 6–23)
CO2: 26 mEq/L (ref 19–32)
CREATININE: 0.87 mg/dL (ref 0.50–1.35)
Calcium: 9.4 mg/dL (ref 8.4–10.5)
Chloride: 103 mEq/L (ref 96–112)
Glucose, Bld: 95 mg/dL (ref 70–99)
Potassium: 4 mEq/L (ref 3.5–5.3)
Sodium: 139 mEq/L (ref 135–145)
Total Protein: 6.7 g/dL (ref 6.0–8.3)

## 2014-07-08 LAB — LIPID PANEL
Cholesterol: 211 mg/dL — ABNORMAL HIGH (ref 0–200)
HDL: 59 mg/dL (ref >=40)
LDL CALC: 107 mg/dL — AB (ref 0–99)
Total CHOL/HDL Ratio: 3.6 Ratio
Triglycerides: 226 mg/dL — ABNORMAL HIGH (ref <150)
VLDL: 45 mg/dL — ABNORMAL HIGH (ref 0–40)

## 2014-07-16 ENCOUNTER — Encounter: Payer: Self-pay | Admitting: *Deleted

## 2014-08-06 ENCOUNTER — Other Ambulatory Visit: Payer: Self-pay | Admitting: Family Medicine

## 2014-08-26 ENCOUNTER — Ambulatory Visit: Payer: Self-pay | Admitting: Family Medicine

## 2014-09-15 ENCOUNTER — Ambulatory Visit (INDEPENDENT_AMBULATORY_CARE_PROVIDER_SITE_OTHER): Payer: Self-pay | Admitting: Family Medicine

## 2014-09-15 ENCOUNTER — Encounter: Payer: Self-pay | Admitting: Family Medicine

## 2014-09-15 DIAGNOSIS — E890 Postprocedural hypothyroidism: Secondary | ICD-10-CM

## 2014-09-15 LAB — TSH: TSH: 0.041 u[IU]/mL — AB (ref 0.350–4.500)

## 2014-09-15 NOTE — Progress Notes (Signed)
   Subjective:    Patient ID: [name], male    DOB: 10/08/1975, 39 y.o.   MRN: [id number]  HPI 39 year old Hispanic male presents for follow-up of hypothyroidism. Patient has postsurgical hypothyroidism. Currently taking Synthroid 200 g daily. Due for recheck of TSH. Currently denies symptoms of hypothyroidism including no hair loss, no dry skin, no palpitations, no constipation.  Patient does report small amount weight loss since last visit secondary to daily exercise. He has been riding his bike on a daily basis.   Review of Systems No fevers or chills    Objective:   Physical Exam Vitals: Reviewed Gen.: Pleasant Hispanic male, no acute distress, interpreter present HEENT: Normocephalic, pupils are equal round and reactive to light, extraocular movements are intact, no scleral icterus, moist mucous membranes, neck was supple, no thyromegaly Cardiac: Regular rate and rhythm, S1 and S2 present, no murmurs, no heaves or thrills Respiratory: Clear to patient bilaterally, normal effort  Surgical records are not available for review in Epic      Assessment & Plan:  Please see problem specific assessment and plan.

## 2014-09-15 NOTE — Assessment & Plan Note (Signed)
Patient presents for follow-up of hypothyroidism. Reports compliance with daily Synthroid 200 g daily. -Check TSH today and adjust dose accordingly

## 2014-09-15 NOTE — Patient Instructions (Signed)
It was nice to meet you today.  Dr. Randolm IdolFletke or Dr. Richarda BladeAdamo will call you with the results of your thyroid test.

## 2014-09-17 ENCOUNTER — Telehealth: Payer: Self-pay | Admitting: Family Medicine

## 2014-09-17 MED ORDER — LEVOTHYROXINE SODIUM 175 MCG PO TABS: 175.0000 ug | ORAL_TABLET | Freq: Every day | ORAL | Status: DC

## 2014-09-17 NOTE — Telephone Encounter (Signed)
Attempted to contact patient with thyroid results. Left message that I will attempt to contact again in the next 24 hours. TSH low. Will decrease synthroid to 175 mg daily (prescription sent).

## 2014-09-18 ENCOUNTER — Encounter: Payer: Self-pay | Admitting: Family Medicine

## 2014-09-18 NOTE — Telephone Encounter (Signed)
Attempted to contact patient using spanish interpretor. No answer. Left message stating that I will send results via a letter.

## 2014-10-14 NOTE — Telephone Encounter (Signed)
Erroneous

## 2015-01-01 ENCOUNTER — Other Ambulatory Visit: Payer: Self-pay | Admitting: Family Medicine

## 2015-01-01 DIAGNOSIS — E030 Congenital hypothyroidism with diffuse goiter: Secondary | ICD-10-CM

## 2015-01-01 MED ORDER — LEVOTHYROXINE SODIUM 175 MCG PO TABS: ORAL_TABLET | ORAL | Status: DC

## 2015-01-01 NOTE — Telephone Encounter (Signed)
Refill sent already

## 2015-01-01 NOTE — Telephone Encounter (Signed)
Pt wife calling and states that the pt needs an urgent refill on levothyroxine [name](SYNTHROID, LEVOTHROID) [facility] [address]. She states that they thought there was another refill left and there wasn't and now he is out and the weekend is approaching. They would really appreciate it if we could get this sent in today. Thank you, Dorothey BasemanSadie Reynolds, ASA

## 2015-03-25 ENCOUNTER — Other Ambulatory Visit: Payer: Self-pay | Admitting: Family Medicine

## 2015-03-25 DIAGNOSIS — E039 Hypothyroidism, unspecified: Secondary | ICD-10-CM

## 2015-04-30 IMAGING — CR DG CHEST 2V
2 series · 2 of 2 positions shown · non-contrast
Comparison: None.

CLINICAL DATA: Chest pain.

EXAM:
CHEST  2 VIEW

[w chest pa]
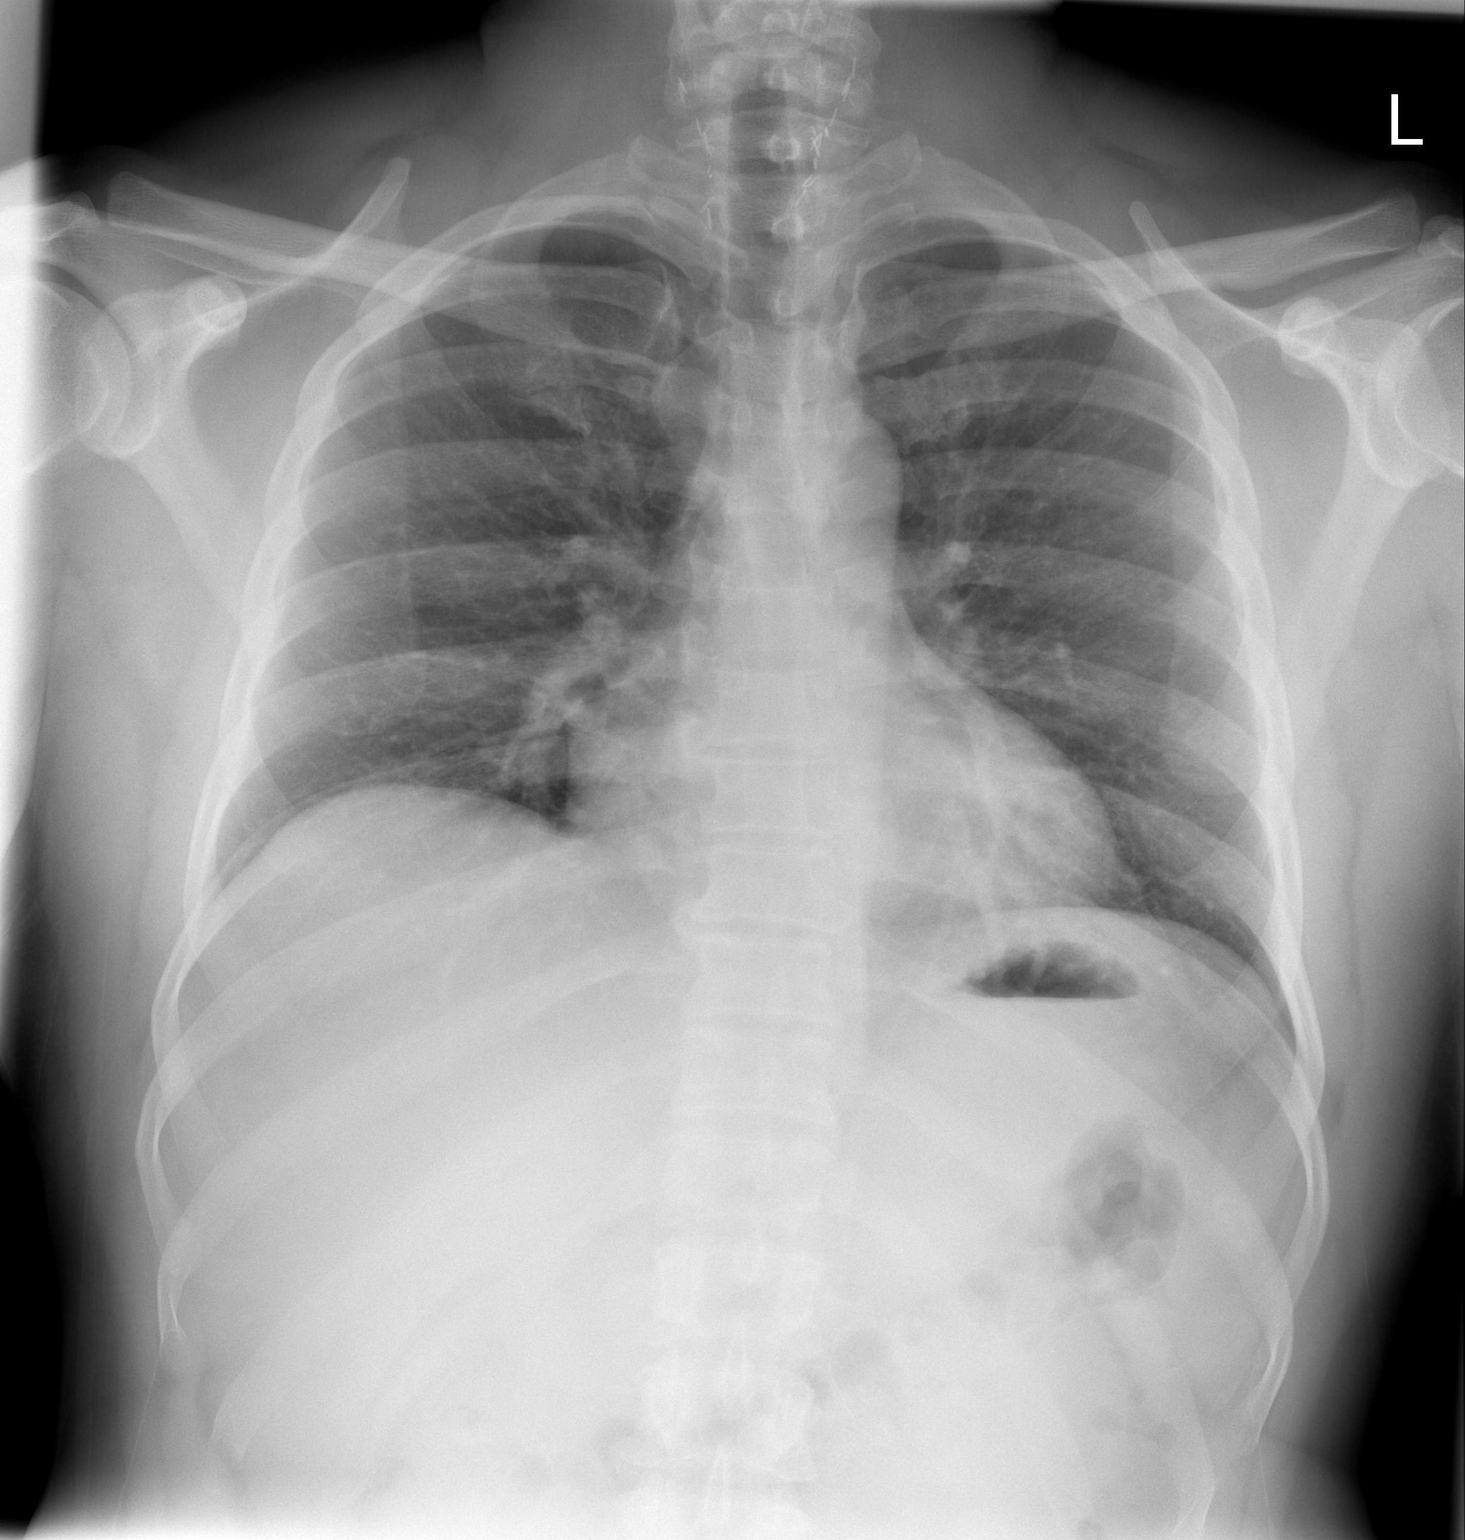

[w chest lat]
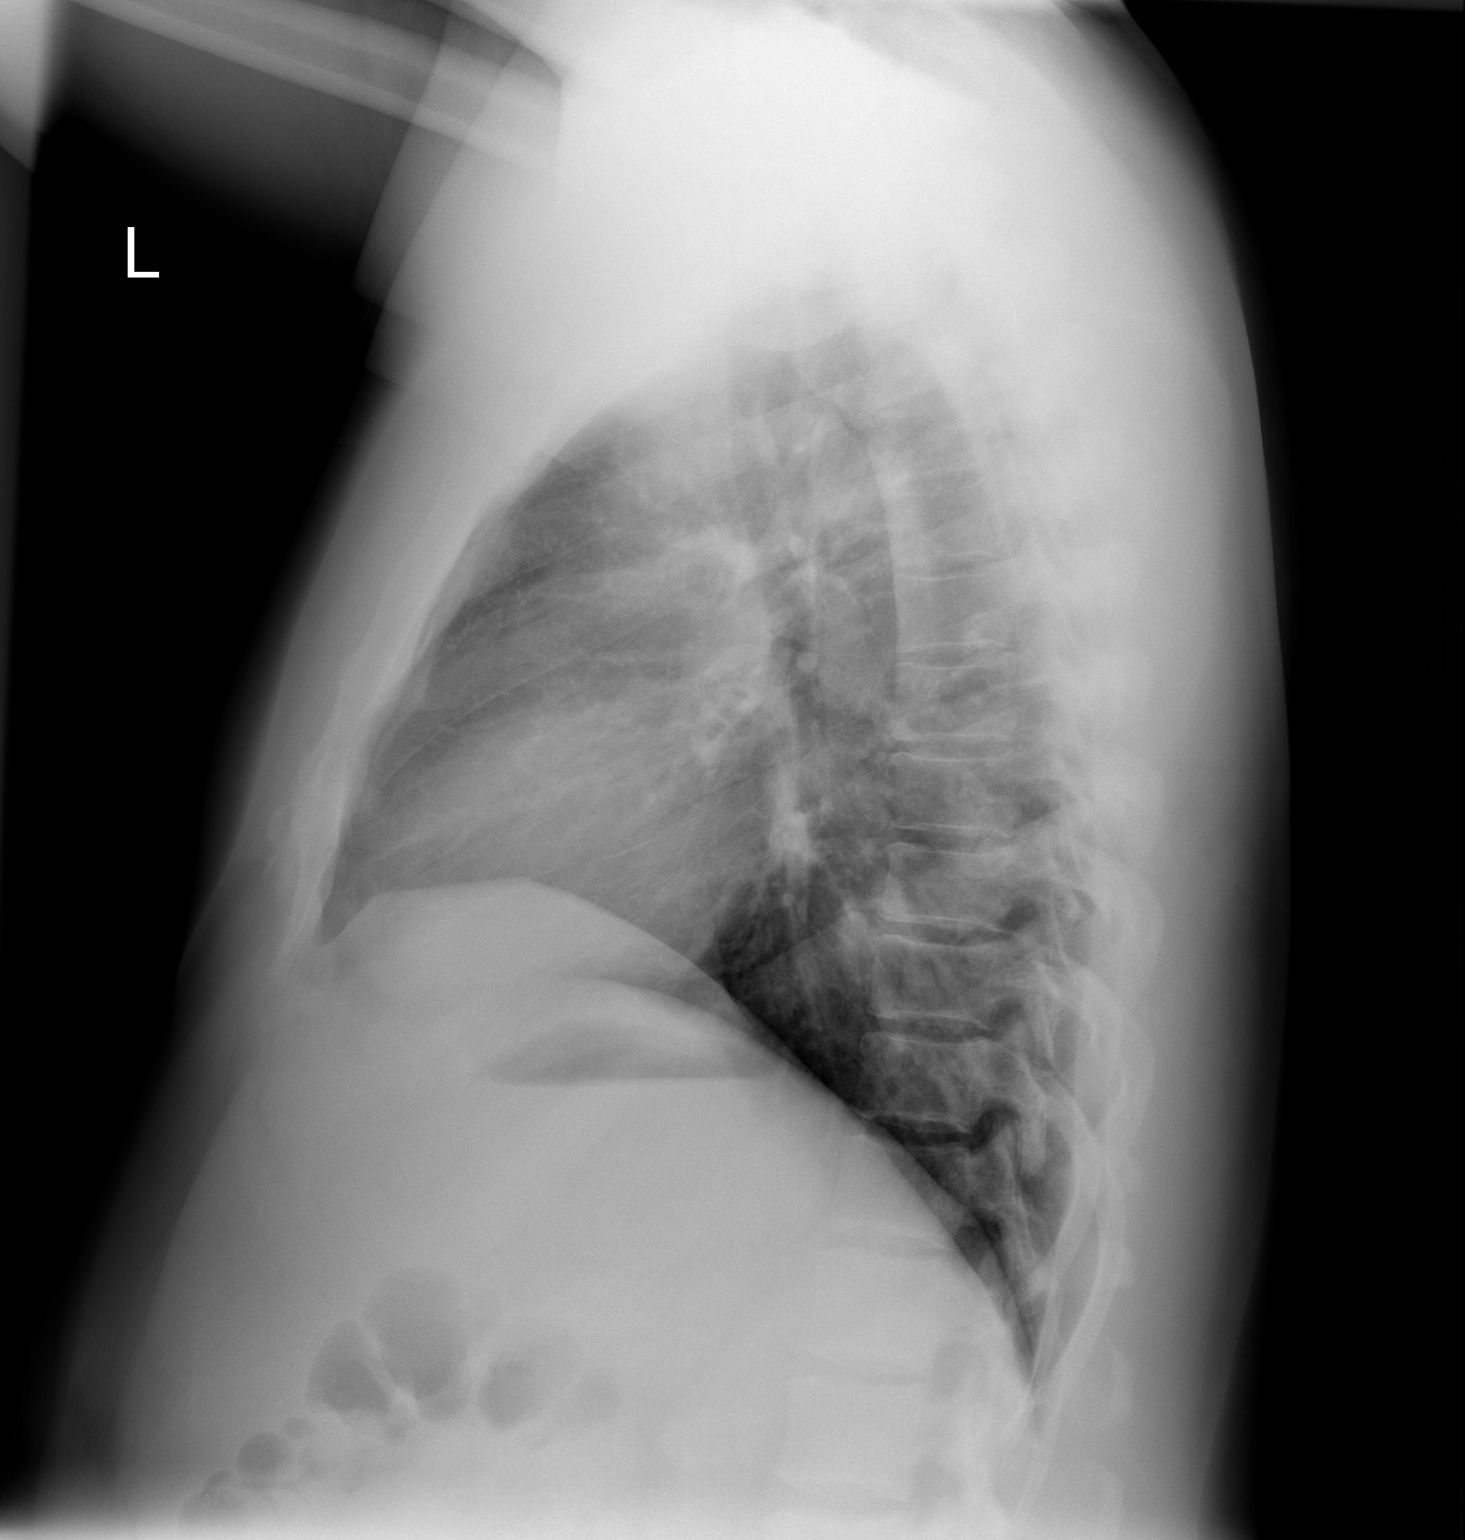

[2 of 2 positions shown; findings below may reference images not displayed]

FINDINGS: The cardiomediastinal silhouette within normal limits. The lungs are
mildly hypoinflated with slight elevation of the right
hemidiaphragm. No airspace consolidation, edema, pleural effusion,
or pneumothorax is identified. No acute osseous abnormality is
identified.
IMPRESSION: No active cardiopulmonary disease.

## 2015-11-29 ENCOUNTER — Ambulatory Visit (INDEPENDENT_AMBULATORY_CARE_PROVIDER_SITE_OTHER): Payer: Self-pay | Admitting: Internal Medicine

## 2015-11-29 ENCOUNTER — Encounter: Payer: Self-pay | Admitting: Internal Medicine

## 2015-11-29 ENCOUNTER — Ambulatory Visit (HOSPITAL_COMMUNITY)
Admission: RE | Admit: 2015-11-29 | Discharge: 2015-11-29 | Disposition: A | Discharge status: Home / Self Care | Payer: Self-pay | Source: Ambulatory Visit | Attending: Family Medicine | Admitting: Family Medicine

## 2015-11-29 VITALS — BP 120/80 | HR 74 | Temp 98.6°F | Ht 64.0 in | Wt 167.0 lb

## 2015-11-29 DIAGNOSIS — E039 Hypothyroidism, unspecified: Secondary | ICD-10-CM

## 2015-11-29 DIAGNOSIS — R1084 Generalized abdominal pain: Secondary | ICD-10-CM

## 2015-11-29 DIAGNOSIS — R195 Other fecal abnormalities: Secondary | ICD-10-CM

## 2015-11-29 DIAGNOSIS — R109 Unspecified abdominal pain: Secondary | ICD-10-CM

## 2015-11-29 DIAGNOSIS — K219 Gastro-esophageal reflux disease without esophagitis: Secondary | ICD-10-CM

## 2015-11-29 DIAGNOSIS — R079 Chest pain, unspecified: Secondary | ICD-10-CM | POA: Insufficient documentation

## 2015-11-29 DIAGNOSIS — E785 Hyperlipidemia, unspecified: Secondary | ICD-10-CM

## 2015-11-29 DIAGNOSIS — R141 Gas pain: Secondary | ICD-10-CM

## 2015-11-29 DIAGNOSIS — R5383 Other fatigue: Secondary | ICD-10-CM

## 2015-11-29 DIAGNOSIS — R9431 Abnormal electrocardiogram [ECG] [EKG]: Secondary | ICD-10-CM | POA: Insufficient documentation

## 2015-11-29 LAB — HEMOGLOBIN POC (KUC): HEMOGLOBIN POC (KUC): 16.2 g/dL (ref 13–17)

## 2015-11-29 LAB — TSH: TSH: 0.59 mIU/L (ref 0.40–4.50)

## 2015-11-29 MED ORDER — SIMETHICONE 80 MG PO TABS: ORAL_TABLET | ORAL | 2 refills | Status: DC

## 2015-11-29 MED ORDER — LEVOTHYROXINE SODIUM 175 MCG PO TABS: ORAL_TABLET | ORAL | 11 refills | Status: DC

## 2015-11-29 MED ORDER — OMEPRAZOLE 20 MG PO CPDR: 20.0000 mg | DELAYED_RELEASE_CAPSULE | Freq: Every day | ORAL | 3 refills | Status: AC

## 2015-11-29 NOTE — Patient Instructions (Addendum)
Mr. [name],  Thank you for coming in today.  I recommend taking omeprazole 20 mg daily for reflux and simethicone for gas. Regular alcohol use can make reflux worse. I would recommend decreasing alcohol intake.  I will call you with lab results and send a letter.  You are due for flu shot -- this will be cheapest at your local pharmacy instead of our clinic.  Please follow-up in about 1 month to make sure your symptoms are getting better.  Best, Dr. Sampson Goon  El Sr. Rodrguez-Segundo,  Gracias por venir hoy.  Recomiendo tomar omeprazol 20 mg por da para el reflujo y simeticona para el gas. El consumo regular de alcohol puede empeorar el reflujo. Yo recomendara disminuir la ingesta de alcohol.  Te llamar con resultados de laboratorio y Agricultural consultant carta.  Usted tiene la vacuna contra la gripe - esto ser ms barato en su farmacia local en lugar de nuestra clnica.  Por favor, siga en un mes para asegurarse de que sus sntomas estn mejorando.  Mejor, Dr. Jaclynn Guarneri por reflujo gastroesofgico en los adultos (Gastroesophageal Reflux Disease, Adult) Normalmente, los alimentos descienden por el esfago y se depositan en el estmago para su digestin. Sin embargo, cuando una persona tiene enfermedad por reflujo gastroesofgico (ERGE), los alimentos y el cido estomacal regresan al esfago. Cuando esto ocurre, el esfago se irrita y se inflama. Con el tiempo, la ERGE puede provocar la formacin de pequeas perforaciones (lceras) en la mucosa del esfago.  CAUSAS Un problema del msculo que se encuentra entre el esfago y Investment banker, corporate (esfnter esofgico inferior o EEI) es la causa de esta enfermedad. Por lo general, el esfnter esofgico inferior se cierra despus de que los alimentos pasan a travs del esfago hacia el [place]. Cuando el EEI est debilitado o no es normal, no se cierra correctamente, lo que permite el paso retrgrado de los alimentos y el  cido estomacal al esfago. Algunas sustancias de la dieta, algunos medicamentos y Materials engineer enfermedades pueden debilitar este esfnter, entre ellos:  Consumo de tabaco.  [place].  Hernia de hiato.  Consumo excesivo de alcohol.  Algunos alimentos y [address], como el caf, el chocolate, las cebollas y Interior and spatial designer. FACTORES DE RIESGO Es ms probable que esta afeccin se manifieste en:  Los personas con sobrepeso.  Las personas con trastornos del tejido conjuntivo.  Las personas que toman antiinflamatorios no esteroides (AINE). SNTOMAS Los sntomas de esta afeccin incluyen lo siguiente:  Merchant navy officer.  Dificultad o dolor al tragar.  Sensacin de Warehouse manager un bulto en la garganta.  Sabor amargo en la boca.  Mal aliento.  Gran cantidad de saliva.  Malestar estomacal o meteorismo.  Flatulencias.  Dolor en el pecho.  Falta de aire o sibilancias.  Tos permanente (crnica) o tos nocturna.  Desgaste el esmalte dental.  Prdida de peso. El dolor en el pecho puede deberse a muchas afecciones diferentes. Consulte al mdico si tiene Journalist, newspaper. DIAGNSTICO El mdico le har una historia clnica y un examen fsico. Para determinar si la ERGE es leve o grave, el mdico tambin puede controlar la respuesta al [place]. Tambin pueden hacerle otros estudios, por ejemplo:  Una endoscopia para examinar el estmago y el esfago con Neomia Dear pequea cmara.  Un estudio que determina el nivel de acidez en el esfago.  Un estudio que mide la presin que hay en el esfago.  Un estudio de deglucin de bario o un estudio modificado de deglucin de bario  para mostrar la forma, el tamao y el funcionamiento del esfago. TRATAMIENTO El objetivo del tratamiento es aliviar los sntomas y Automotive engineerevitar las complicaciones. El tratamiento de esta afeccin puede variar en funcin de la gravedad de los sntomas. El mdico podr indicar lo siguiente:  Cambios en la  dieta.  Medicamentos.  Ciruga. INSTRUCCIONES PARA EL CUIDADO EN EL HOGAR Dieta  Siga la dieta que le haya recomendado el mdico, la cual puede incluir evitar alimentos y bebidas tales como:  Caf y t (con o sin cafena).  Bebidas que contengan alcohol.  Bebidas energizantes y deportivas.  Gaseosas o refrescos.  Chocolate y cacao.  Menta y esencias de [address].  Ajo y cebollas.  Rbano picante.  Alimentos muy condimentados y cidos, entre ellos, pimientos, [place] en polvo, curry en polvo, vinagre, salsas picantes y [address] bar[address]oa.  Frutas ctricas y sus jugos, como naranjas, limones y limas.  Alimentos a base de tomates, como salsa roja, [place], salsa y pizza con salsa roja.  Alimentos fritos y Lexicographergrasos, como rosquillas, papas fritas y aderezos con alto contenido de Holiday representativegrasa.  Carnes con alto contenido de [place] Lakegrasa, como hot dogs y cortes grasos de carnes rojas y blancas, por ejemplo, filetes de entrecot, salchicha, jamn y tocino.  Productos lcteos con alto contenido de Modest Towngrasa, como Holcombleche entera, La Marquemantequilla y queso crema.  Haga comidas pequeas y frecuentes Freight forwarderdurante el da en lugar de comidas abundantes.  Evite beber mucho lquido con las comidas.  No coma durante las 2 o 3horas previas a la hora de [place]acostarse.  No se acueste inmediatamente despus de comer.  No haga actividad fsica enseguida despus de comer. Instrucciones generales  Est atento a cualquier cambio en los sntomas.  Tome los medicamentos de venta libre y los recetados solamente como se lo haya indicado el mdico. No tome aspirina, ibuprofeno ni otros antiinflamatorios no esteroides (AINE), a menos que se lo haya indicado el mdico.  No consuma ningn producto que contenga tabaco, lo que incluye cigarrillos, tabaco de Theatre managermascar y Administrator, Civil Servicecigarrillos electrnicos. Si necesita ayuda para dejar de fumar, consulte al mdico.  Use ropas sueltas. No use prendas ajustadas alrededor de la cintura que ejerzan presin en el  abdomen.  Levante (eleve) 6pulgadas (15centmetros) la cabecera de la cama.  Trate de reducir J. C. Penneyel nivel de estrs con actividades como el yoga o la meditacin. Si necesita ayuda para reducir J. C. Penneyel nivel de estrs, consulte al mdico.  Si tiene sobrepeso, Media planneradelgace hasta alcanzar un peso saludable. Hable con el mdico acerca de su peso ideal y pdale asesoramiento en cuanto a la dieta que debe seguir para Aeronautical engineerpoder alcanzarlo.  Concurra a todas las visitas de control como se lo haya indicado el mdico. Esto es importante. SOLICITE ATENCIN MDICA SI:  Aparecen nuevos sntomas.  Baja de peso sin causa aparente.  Tiene dificultad para tragar o siente dolor al Darden Restaurantshacerlo.  Tiene sibilancias o tos persistente.  Los sntomas no mejoran con Scientist, research (medical)el tratamiento.  Tiene la voz ronca. SOLICITE ATENCIN MDICA DE Engelhard CorporationNMEDIATO SI:  Tiene dolor en los brazos, el cuello, los St[place] Parkmaxilares, la dentadura o la espalda.  Berenice Primasranspira, se marea o tiene sensacin de desvanecimiento.  Siente falta de aire o Journalist, newspaperdolor en el pecho.  Vomita y el vmito es parecido a la sangre o a los granos de caf.  Se desmaya.  Las heces son sanguinolentas o de color negro.  No puede tragar, beber o comer.   Esta informacin no tiene Theme park managercomo fin reemplazar el consejo del mdico. Asegrese de hacerle  al mdico cualquier pregunta que tenga.   Document Released: 12/07/2004 Document Revised: 11/18/2014 Elsevier Interactive Patient Education [employer]

## 2015-11-29 NOTE — Progress Notes (Signed)
[facility] Progress Note  Subjective:  [name] is a 40-y/o male who presents for feeling unwell. He has a history of GERD, hypothyroidism, and alcohol abuse. Visit conducted with help of Spanish video interpreter [name] [phone]). Patient's wife who speaks some English also present.   Feeling unwell and abdominal pain: - For the last 3 weeks, patient has had increased fatigue and heartburn. He also reports a lot of gas pain that spread into his chest. - Experiencing burning after eating about twice a week; takes OTC omeprazole from [employer] but does not know dose - He denies diarrhea but reports a lot of gas and feeling nauseated - In addition, patient has had left-sided back pain that extends to the front for the past 3 days. He did fall while running wearing a weighted vest last week but thinks he fell mostly on right hand and shoulder.  - Of note, patient's brother died from a heart attack last week, in his 23s.  - Chest pain is a sensation of tightness that occurs with what he calls gas pains. Can have sensation of trouble breathing when this occurs. Improves with burping.  - Has not been eating much for the past week but continues to drink plenty of liquids. Drinks about a gallon of water daily since he runs for 45 minutes to an hour most days. - Has had elevated BP readings in the past but has not been on anti-hypertensive medication. Was on a statin a couple of years ago but says he "finished the pills" and thought he was done taking them.  - Has regular bowel movements - H. pylori testing was negative in 2013 and has not traveled recently.  ROS: Denies hematochezia, melena or hemoptysis but has had some darker brown stools. No fevers or chills. Endorses decreased appetite.   No Known Allergies   Social: Drinks about 15 beers a week  Objective: Blood pressure 120/80, pulse 74, temperature 98.6 F (37 C), temperature source Oral, height 5\' 4"  (1.626  m), weight 167 lb (75.8 kg), SpO2 99 %. Body mass index is 28.67 kg/m. Constitutional: Well-appearing male, overweight, in NAD HENT: No oral lesions. Normal oropharynx.  Cardiovascular: RRR, S1, S2, no m/r/g.  Pulmonary/Chest: Effort normal and breath sounds normal. No respiratory distress.  Abdominal: Soft. +BS, mild TTP over epigastric region and RUQ, ND, no rebound or guarding.  Musculoskeletal: TTP over ribs of left flank. No CVA tenderness.  Neurological: AOx3, no focal deficits. Skin: Skin is warm and dry. No rash or ecchymoses noted. No erythema.  Psychiatric: Normal mood and somewhat anxious affect.  Vitals reviewed  Assessment/Plan: Abdominal pain - History most consistent with worsening GERD and/or gas pain. Suspect exacerbated by frequent alcohol use. - Prescribed omeprazole 20 mg daily. May need increase to 40 mg daily. - Prescribed simethicone 80 mg prn after meals and at bedtime for gas. - Will obtain POC hgb for complaint of increased fatigue and possible dark stools. - Will obtain lipase to r/o pancreatitis given decreased appetite and regular alcohol use. - Recommended decreasing alcohol intake and provided handout on GERD  Chest pain - Timing seems consistent with gas pains. Not experiencing chest pain at present and has not had radiation of pain. Describes pain as more of a tightness. - EKG obtained and did not show ST elevation but perhaps some evidence of LVH.  - Will obtain lipid panel (patient has not eaten today), TSH, and CMP - Will calculate ASCVD risk score based on  lipid panel results - Grief with recent loss of brother may also be contributing  Follow-up in 1 month to reassess for improvement in symptoms. Recommended going to ED if unable to keep liquids down or worsening chest pain.   Dani GobbleHillary Siren Porrata, MD [facility], PGY-2

## 2015-11-30 ENCOUNTER — Telehealth: Payer: Self-pay | Admitting: Internal Medicine

## 2015-11-30 ENCOUNTER — Encounter: Payer: Self-pay | Admitting: Internal Medicine

## 2015-11-30 LAB — COMPLETE METABOLIC PANEL WITH GFR
ALT: 46 U/L (ref 9–46)
AST: 43 U/L — ABNORMAL HIGH (ref 10–40)
Albumin: 4.8 g/dL (ref 3.6–5.1)
Alkaline Phosphatase: 62 U/L (ref 40–115)
BILIRUBIN TOTAL: 0.6 mg/dL (ref 0.2–1.2)
BUN: 13 mg/dL (ref 7–25)
CO2: 28 mmol/L (ref 20–31)
CREATININE: 0.78 mg/dL (ref 0.60–1.35)
Calcium: 9.8 mg/dL (ref 8.6–10.3)
Chloride: 101 mmol/L (ref 98–110)
GFR, Est African American: >89 mL/min (ref >=60)
GFR, Est Non African American: >89 mL/min (ref >=60)
GLUCOSE: 87 mg/dL (ref 65–99)
Potassium: 3.9 mmol/L (ref 3.5–5.3)
SODIUM: 140 mmol/L (ref 135–146)
TOTAL PROTEIN: 7.3 g/dL (ref 6.1–8.1)

## 2015-11-30 LAB — LIPID PANEL
CHOL/HDL RATIO: 2.7 ratio (ref <=5.0)
CHOLESTEROL: 234 mg/dL — AB (ref 125–200)
HDL: 87 mg/dL (ref >=40)
LDL Cholesterol: 105 mg/dL (ref <130)
Triglycerides: 209 mg/dL — ABNORMAL HIGH (ref <150)
VLDL: 42 mg/dL — AB (ref <30)

## 2015-11-30 LAB — LIPASE: Lipase: 19 U/L (ref 7–60)

## 2015-11-30 NOTE — Assessment & Plan Note (Addendum)
-   History most consistent with worsening GERD and/or gas pain. Suspect exacerbated by frequent alcohol use. - Prescribed omeprazole 20 mg daily. May need increase to 40 mg daily. - Prescribed simethicone 80 mg prn after meals and at bedtime for gas. - Will obtain POC hgb for complaint of increased fatigue and possible dark stools. - Will obtain lipase to r/o pancreatitis given decreased appetite and regular alcohol use. - Recommended decreasing alcohol intake and provided handout on GERD

## 2015-11-30 NOTE — Telephone Encounter (Signed)
Attempted to leave message for patient with assistance of [address] Spanish interpreter. Young son picked up the phone, and clinic's call back number was given. Will send a letter to patient with results. However, if patient calls back, please inform him that the test results were normal except for slightly elevated liver enzymes. I recommend decreasing alcohol intake, as this can help improve liver function and improve reflux.

## 2015-11-30 NOTE — Assessment & Plan Note (Signed)
-   Timing seems consistent with gas pains. Not experiencing chest pain at present and has not had radiation of pain. Describes pain as more of a tightness. - EKG obtained and did not show ST elevation but perhaps some evidence of LVH.  - Will obtain lipid panel (patient has not eaten today), TSH, and CMP - Will calculate ASCVD risk score based on lipid panel results - Grief with recent loss of brother may also be contributing

## 2015-12-01 NOTE — Telephone Encounter (Signed)
I did the interpretation of PCP"s message. [name].

## 2015-12-03 ENCOUNTER — Telehealth: Payer: Self-pay | Admitting: *Deleted

## 2015-12-03 NOTE — Telephone Encounter (Signed)
Pt wife came to get copy of pt lab and did not see any DPR form so told wife that pt would need to pick this up or he could call and request us to mail him a copy.  Also mentioned that if his wife is someone who is able to have this information they should complete a DPR form. Lamonte SakaiZimmerman Rumple, Dosha Broshears D, [place]CMA

## 2016-10-07 ENCOUNTER — Other Ambulatory Visit: Payer: Self-pay | Admitting: Internal Medicine

## 2016-10-07 DIAGNOSIS — E039 Hypothyroidism, unspecified: Secondary | ICD-10-CM

## 2018-01-28 ENCOUNTER — Other Ambulatory Visit: Payer: Self-pay | Admitting: *Deleted

## 2018-01-28 DIAGNOSIS — E039 Hypothyroidism, unspecified: Secondary | ICD-10-CM

## 2018-01-28 DIAGNOSIS — E890 Postprocedural hypothyroidism: Secondary | ICD-10-CM

## 2018-01-28 MED ORDER — LEVOTHYROXINE SODIUM 175 MCG PO TABS: ORAL_TABLET | ORAL | 0 refills | Status: DC

## 2018-01-28 NOTE — Telephone Encounter (Signed)
Hi, I'm going to refill this prescription for a month but I don't see that we have thyroid labs for 2 years. Can you please ask him to come in and check them? I'll put in a lab order for the next month if he wants to check them and get a longer prescription or he can come in for an appointment if he has any concerns. Thanks

## 2018-02-26 ENCOUNTER — Other Ambulatory Visit: Payer: Self-pay

## 2018-02-26 DIAGNOSIS — E890 Postprocedural hypothyroidism: Secondary | ICD-10-CM

## 2018-02-27 LAB — TSH: TSH: 11.38 u[IU]/mL — AB (ref 0.450–4.500)

## 2018-03-01 ENCOUNTER — Other Ambulatory Visit: Payer: Self-pay | Admitting: Student in an Organized Health Care Education/Training Program

## 2018-03-01 ENCOUNTER — Telehealth: Payer: Self-pay | Admitting: *Deleted

## 2018-03-01 DIAGNOSIS — E890 Postprocedural hypothyroidism: Secondary | ICD-10-CM

## 2018-03-01 DIAGNOSIS — E039 Hypothyroidism, unspecified: Secondary | ICD-10-CM

## 2018-03-01 MED ORDER — LEVOTHYROXINE SODIUM 200 MCG PO TABS: 200.0000 ug | ORAL_TABLET | Freq: Every day | ORAL | 0 refills | Status: DC

## 2018-03-01 NOTE — Telephone Encounter (Signed)
I'm going to fill his medication with an increase in the dose (175 to ) since the TSH was elevated. I'd like him to come back to the clinic to get his TSH checked again in 6 weeks. Can you please help relay this information?

## 2018-03-01 NOTE — Telephone Encounter (Signed)
Nevermind. I see that he had an appointment at our clinic. I just don't know with who.

## 2018-03-01 NOTE — Telephone Encounter (Signed)
Hi! I see the lab was resulted from the 17th which is abnormal and would designate a dose change, but I don't know who ordered the lab. Was he seen in our clinic?

## 2018-03-01 NOTE — Telephone Encounter (Signed)
Pt wife wants to know the results of his TSH and wether his meds need to be changed or not.  He has 2 days of meds left.  Will forward to MD. Aiyanah Kalama, Maryjo RochesterJessica Dawn, CMA

## 2018-03-01 NOTE — Assessment & Plan Note (Signed)
Pt TSH elevated >11.  Refilling synthroid prescription with increased dose to . Instructions to patient to return in 6 weeks for repeat of thyroid labs.

## 2018-03-04 ENCOUNTER — Other Ambulatory Visit: Payer: Self-pay | Admitting: Student in an Organized Health Care Education/Training Program

## 2018-03-04 DIAGNOSIS — E039 Hypothyroidism, unspecified: Secondary | ICD-10-CM

## 2018-03-04 NOTE — Telephone Encounter (Signed)
Original script was sent to wrong pharmacy, resent as wife requested. Marland Kitchen.emdm

## 2018-03-04 NOTE — Telephone Encounter (Signed)
Pts wife called nurse line about pts TSH and medication. Informed with his labs were abnormal and his pcp has increased his dosage from to . Pt to come back in in 6weeks for another TSH check. Pts wife verbalized understanding.

## 2018-03-26 ENCOUNTER — Other Ambulatory Visit: Payer: Self-pay

## 2018-03-26 DIAGNOSIS — E890 Postprocedural hypothyroidism: Secondary | ICD-10-CM

## 2018-03-27 LAB — TSH: TSH: 2.56 u[IU]/mL (ref 0.450–4.500)

## 2018-03-27 NOTE — Progress Notes (Signed)
la 

## 2018-03-28 ENCOUNTER — Encounter: Payer: Self-pay | Admitting: Student in an Organized Health Care Education/Training Program

## 2018-04-02 ENCOUNTER — Other Ambulatory Visit: Payer: Self-pay

## 2018-07-01 ENCOUNTER — Telehealth: Payer: Self-pay | Admitting: *Deleted

## 2018-07-01 NOTE — Telephone Encounter (Signed)
Pt wife lm on nurse line requesting a corona test for her husband because he has symptoms.   Called back for more info, but had to LM. Jone Baseman, CMA

## 2018-07-03 ENCOUNTER — Telehealth: Payer: Self-pay

## 2018-07-03 ENCOUNTER — Other Ambulatory Visit: Payer: Self-pay

## 2018-07-03 ENCOUNTER — Encounter: Payer: Self-pay | Admitting: Family Medicine

## 2018-07-03 ENCOUNTER — Telehealth (INDEPENDENT_AMBULATORY_CARE_PROVIDER_SITE_OTHER): Payer: Self-pay | Admitting: Family Medicine

## 2018-07-03 DIAGNOSIS — Z20822 Contact with and (suspected) exposure to covid-19: Secondary | ICD-10-CM

## 2018-07-03 DIAGNOSIS — Z20828 Contact with and (suspected) exposure to other viral communicable diseases: Secondary | ICD-10-CM

## 2018-07-03 NOTE — Progress Notes (Signed)
[name] Telemedicine Visit  Patient consented to have virtual visit. Method of visit: Telephone  Encounter participants: Patient: [name] - located at home Provider: Westley Vasquez - located at [name]  Chief Complaint: fevers,  Cough   HPI: [name] is a pleasant 43 year old gentleman with history of alcohol use, overweight status and hypothyroidism presenting via telephone visit for symptoms of coronavirus.  Due to interpreter we are unable to use the video features of our current platforms.  I discussed the limitations of audio only visits with the patient.  The patient reports 8 days of body aches, intermittent low-grade fevers, a dry nonproductive cough and congestion.  Overall his symptoms are improving considerably.  He suspects he has coronavirus.  He currently works at a [employer] in [place] city.  A number of workers are currently infected with SARS-CoV-2 and have symptoms of COVID-19.  The patient reports he is improving he is eating and drinking well.  He is taking Tylenol for his muscle aches.  He has a lot of questions regarding when he can return to work and if it is safe for his children to visit and then leave the house.  He specifically denies difficulty breathing, dyspnea, hematemesis, headache or inability to tolerate food or fluids.  He has no history of hypertension he has no history of an immunocompromising condition.  He no longer smokes.  He does drink 3-4 beers per day which he is continued to do so   ROS: per HPI  Pertinent PMHx:  Alcohol use Obesity Hypertension  Exam:  Respiratory: Begin full sentences.  He is breathing comfortably on room air  Assessment/Plan:   Diagnoses and all orders for this visit:  Close Exposure to 2019 Novel Coronavirus the patient most likely does have a viral syndrome due to SARS-CoV-2 infection.  I reviewed precautions including staying his home until he is symptom-free off  of all medications for 3 days.  Based upon his very mild symptoms he does not meet criteria for Plaquenil, which has limited efficacy and is only recommended currently by the NIH in randomized clinical trials.  Reviewed strict return precautions.  We discussed the appropriate therapies for helping his muscle cramps including Tylenol and staying hydrated.  Schedule him for a check in on Friday with a telemedicine provider to ensure his breathing continues to do well.  I sent a letter to his home for work that is allowing him to be released from work until April 29.  Wife requested a visit at the end of this appointment.  I have scheduled her for such.   Time spent during visit with patient: 26 minutes

## 2018-07-05 ENCOUNTER — Telehealth: Payer: Self-pay

## 2018-07-05 ENCOUNTER — Telehealth (INDEPENDENT_AMBULATORY_CARE_PROVIDER_SITE_OTHER): Payer: Self-pay | Admitting: Family Medicine

## 2018-07-05 ENCOUNTER — Other Ambulatory Visit: Payer: Self-pay

## 2018-07-05 NOTE — Progress Notes (Signed)
Patient returned call.  He is no longer running a fever or having body aches.  Also his sense of smell and taste are coming back.  He does still have a cough (which has improved) but no SOB.  Will forward to MD. Jone Baseman, CMA

## 2018-07-05 NOTE — Progress Notes (Signed)
Multiple attempts made to contact patient. I have left messages on 3 occasions. Cell phone busy all of the times, I left messages on his land line. [place] interpreter used.  [facility]-RN, please put patient on your follow-up call list. Contact him on Monday for check up. Thanks.

## 2018-11-07 ENCOUNTER — Ambulatory Visit: Payer: Self-pay | Admitting: Family Medicine

## 2018-11-08 ENCOUNTER — Ambulatory Visit (INDEPENDENT_AMBULATORY_CARE_PROVIDER_SITE_OTHER): Payer: Self-pay | Admitting: Family Medicine

## 2018-11-08 ENCOUNTER — Other Ambulatory Visit: Payer: Self-pay

## 2018-11-08 VITALS — BP 128/82 | HR 78 | Wt 184.6 lb

## 2018-11-08 DIAGNOSIS — L089 Local infection of the skin and subcutaneous tissue, unspecified: Secondary | ICD-10-CM

## 2018-11-08 DIAGNOSIS — L723 Sebaceous cyst: Secondary | ICD-10-CM

## 2018-11-08 MED ORDER — DOXYCYCLINE HYCLATE 100 MG PO TABS: 100.0000 mg | ORAL_TABLET | Freq: Two times a day (BID) | ORAL | 0 refills | Status: DC

## 2018-11-08 MED ORDER — NAPROXEN 500 MG PO TABS: 500.0000 mg | ORAL_TABLET | Freq: Two times a day (BID) | ORAL | 0 refills | Status: DC

## 2018-11-08 NOTE — Assessment & Plan Note (Signed)
I&D was performed, and patient tolerated this well.  Patient was also prescribed doxycycline 100 mg twice daily for 7 days and naproxen 500 mg twice daily as needed for pain.  He was counseled that the cyst will likely recur since the capsule was not removed today due to inflammation.  He will need to wait until he has no inflammation in the area and then make an appointment for this to be removed.  He expressed understanding.

## 2018-11-08 NOTE — Progress Notes (Deleted)
   Subjective:    [name] - 43 y.o. male MRN [id number]  Date of birth: 08/26/1975  CC:  [name] is here for ***.  HPI:   Health Maintenance:  - *** Health Maintenance Due  Topic Date Due  . TETANUS/TDAP  03/27/2018  . INFLUENZA VACCINE  10/12/2018    -  reports that he has quit smoking. His smoking use included cigarettes. He has never used smokeless tobacco. - Review of Systems: Per HPI. - Past Medical History: Patient Active Problem List   Diagnosis Date Noted  . Elevated LFTs 09/29/2013  . Hypercholesteremia 09/29/2013  . Pterygium of both eyes 09/08/2013  . Dystrophic nail 09/08/2013  . Well adult exam 12/15/2011  . BPH (benign prostatic hyperplasia) 12/15/2011  . Overweight(278.02) 12/15/2011  . Elevated blood pressure 09/19/2011  . Chest pain 06/05/2011  . Abdominal pain 10/07/2009  . Alcohol abuse 10/04/2009  . GERD 10/04/2009  . 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  Psych: good insight, alert and oriented        Assessment & Plan:   No problem-specific Assessment & Plan notes found for this encounter.    Maia Breslow, M.D. 11/08/2018, 5:12 PM PGY-3, [place]

## 2018-11-08 NOTE — Progress Notes (Signed)
   Subjective:    [name] - 43 y.o. male MRN [id number]  Date of birth: [birth year]  CC:  Jori Thrall is here for a lesion on the back of his neck.  HPI: Has been present for about a year Has been a small area on the back of the neck Has been nonpainful until three days ago, when it became inflamed, larger, and more tender Was working in the yard when it became more inflamed Denies trauma Cannot sleep due to pain, painful to turn his neck  Health Maintenance:  Health Maintenance Due  Topic Date Due  . TETANUS/TDAP  03/27/2018  . INFLUENZA VACCINE  10/12/2018    -  reports that he has quit smoking. His smoking use included cigarettes. He has never used smokeless tobacco. - Review of Systems: Per HPI. - Past Medical History: Patient Active Problem List   Diagnosis Date Noted  . Infected sebaceous cyst 11/08/2018  . Elevated LFTs 09/29/2013  . Hypercholesteremia 09/29/2013  . Pterygium of both eyes 09/08/2013  . Dystrophic nail 09/08/2013  . Well adult exam 12/15/2011  . BPH (benign prostatic hyperplasia) 12/15/2011  . Overweight(278.02) 12/15/2011  . Elevated blood pressure 09/19/2011  . Chest pain 06/05/2011  . Abdominal pain 10/07/2009  . Alcohol abuse 10/04/2009  . GERD 10/04/2009  . 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  No pore visible.        Assessment & Plan:   Infected sebaceous cyst I&D was performed, and patient tolerated this well.  Patient was also prescribed doxycycline 100 mg twice daily for 7 days and naproxen 500 mg twice daily as needed for pain.  He was counseled that the cyst will likely recur since the capsule was  not removed today due to inflammation.  He will need to wait until he has no inflammation in the area and then make an appointment for this to be removed.  He expressed understanding.  INCISION AND DRAINAGE OF POSTERIOR NECK  A timeout protocol was performed prior to initiating the procedure. The area was prepared and draped in the usual, sterile manner. The site was anesthetized with 3 ml of 1% lidocaine with epinephrine. A linear stab incision along the local skin lines was made and purulent fluid followed by cyst material was expressed. The abcess was probed with hemostat and sequestered pockets were opened. Bleeding was minimal. No packing was placed.   The patient tolerated the procedure well without complications.  Standard post-procedure care is explained and return precautions are given.  Maia Breslow, M.D. 11/08/2018, 5:29 PM PGY-3, [place]

## 2018-11-08 NOTE — Patient Instructions (Addendum)
Toma doxycycline por [employer], dos veces cada dia.  Toma naproxen para dolor dos veces cada dia si necesario.  Por favor haga una cita si tenga mas dolor.   Quiste epidrmico Epidermal Cyst  Un quiste epidrmico es un saco hecho del tejido de la piel. Este saco contiene una sustancia denominada queratina. La queratina es una protena que normalmente es segregada a travs de los folculos pilosos. Cuando la queratina queda atrapada en la capa superior de la piel (epidermis), se puede formar un quiste epidrmico. Los quistes epidrmicos pueden hallarse en Clinical cytogeneticist del cuerpo. Por lo general, estos quistes no son perjudiciales (son benignos), y es posible que no provoquen sntomas a menos que se infecten. Cules son las causas? Esta afeccin puede ser causada por lo siguiente:  Un folculo piloso obstruido.  Un pelo que se riza y vuelve a Dietitian en la piel en vez de crecer hacia fuera de la piel (pelo encarnado).  Un poro obstruido.  Irritacin de la piel.  Una lesin en la piel.  Determinadas afecciones que se transmiten de padres a hijos (hereditarias).  Virus del papiloma humano (VPH).  Dao a Barrister's clerk (crnico) en la piel causado por el sol. Qu incrementa el riesgo? Los siguientes factores pueden hacer que usted sea ms propenso a Actor quistes epidrmicos:  Acn.  Tener sobrepeso.  Tener entre 30 y 62 aos. Cules son los signos o los sntomas? El nico sntoma de esta afeccin puede ser un bulto pequeo e indoloro debajo de la piel. Cuando un quiste epidrmico se rompe, puede infectarse. Entre los sntomas, se pueden incluir los siguientes:  Enrojecimiento.  Inflamacin.  Dolor a Secretary/administrator.  Calor.  Cristy Hilts.  Sale queratina del quiste. La queratina es una sustancia de color blanco grisceo que tiene mal olor.  Sale pus del quiste. Cmo se diagnostica? Esta afeccin se diagnostica mediante un examen fsico.  En algunos casos, es posible  que le tomen [place] de tejido (biopsia) del quiste para examinarlo con microscopio o Web designer en bsqueda de bacterias.  Pueden derivarlo a un mdico especialista en el cuidado de la piel (dermatlogo). Cmo se trata? En muchos casos, los quistes epidrmicos desaparecen por s solos, sin tratamiento. Si el quiste se infecta, el tratamiento puede incluir:  Engineer, drilling y Training and development officer. Este procedimiento lo realiza un mdico. Despus del drenaje, se le podr hacer una ciruga menor para retirar el resto del [place].  Antibiticos.  Inyecciones de medicamentos (corticoesteroides) que ayudan a Risk manager.  Ciruga para extirpar el quiste. Se puede realizar una ciruga si el quiste: ? Se vuelve grande. ? Fredderick [place]. ? Tiene probabilidades de transformarse en cncer.  No intente abrir el quiste usted mismo. Siga estas indicaciones en su casa:  Tome los medicamentos de venta libre y los recetados solamente como se lo haya indicado el mdico.  Si le recetaron un antibitico, tmelo como se lo haya indicado el mdico. No deje de usar el antibitico aunque comience a Sports administrator.  Mantenga limpia y seca el rea que rodea al [place].  Use ropa holgada y [place].  Evite tocarse el quiste.  Revsese el [employer] Media para detectar signos de infeccin. Est atento a los siguientes signos: ? Enrojecimiento, hinchazn o dolor. ? Lquido o sangre. ? Calor. ? Pus o mal olor.  Concurra a todas las visitas de control como se lo haya indicado el mdico. Esto es importante. Cmo se evita?  Use ropa limpia y [place].  Evite usar ropa Indonesiaajustada.  Mantenga la piel limpia y seca. Tome duchas o baos todos Dilleylos das. Comunquese con un mdico si:  El quiste presenta sntomas de infeccin.  El problema no mejora, o empeora.  Le sale un quiste que se ve diferente a otros quistes que haya tenido.  Tiene fiebre. Solicite ayuda inmediatamente si:  Tiene un enrojecimiento que  se extiende desde el quiste hacia el rea que lo rodea. Resumen  Un quiste epidrmico es un saco hecho del tejido de la piel. Por lo general, estos quistes no son perjudiciales (son benignos), y es posible que no provoquen sntomas a menos que se infecten.  Si un quiste se infecta, el tratamiento puede incluir ciruga para abrirlo y Education officer, communitydrenarlo o para Customer service managerretirarlo. El tratamiento tambin puede incluir medicamentos por va oral o por va inyectable.  Tome los medicamentos de venta libre y los recetados solamente como se lo haya indicado el mdico. Si le recetaron un antibitico, tmelo como se lo haya indicado el mdico. No deje de usar el antibitico aunque comience a Actorsentirse mejor.  Comunquese con un mdico si el problema empeora o no mejora.  Concurra a todas las visitas de control como se lo haya indicado el mdico. Esto es importante. Esta informacin no tiene Theme park managercomo fin reemplazar el consejo del mdico. Asegrese de hacerle al mdico cualquier pregunta que tenga. Document Released: 04/10/2006 Document Revised: 10/31/2017 Document Reviewed: 10/31/2017 Elsevier Patient Education  2020 [employer]

## 2018-11-11 ENCOUNTER — Telehealth: Payer: Self-pay

## 2018-11-11 MED ORDER — NAPROXEN 500 MG PO TABS: 500.0000 mg | ORAL_TABLET | Freq: Two times a day (BID) | ORAL | 0 refills | Status: DC

## 2018-11-11 MED ORDER — DOXYCYCLINE HYCLATE 100 MG PO TABS: 100.0000 mg | ORAL_TABLET | Freq: Two times a day (BID) | ORAL | 0 refills | Status: DC

## 2018-11-11 NOTE — Telephone Encounter (Signed)
Patients wife calls nurse line stating they need recent prescriptions resent to [facility] on [address]. I called [facility] and cancelled prescriptions and resent them to [facility].

## 2019-03-28 ENCOUNTER — Other Ambulatory Visit: Payer: Self-pay | Admitting: Student in an Organized Health Care Education/Training Program

## 2019-03-28 DIAGNOSIS — E039 Hypothyroidism, unspecified: Secondary | ICD-10-CM

## 2019-03-28 NOTE — Telephone Encounter (Signed)
Pharmacy needs consent from the provider to change patient's levothyroxine manufacturer from Sandoz as this one is no longer available.  Will forward to MD.  Burnard Hawthorne

## 2019-03-31 NOTE — Telephone Encounter (Signed)
I'm not sure how to go about this. Do I need to call the pharmacy?

## 2019-04-02 ENCOUNTER — Other Ambulatory Visit: Payer: Self-pay | Admitting: Student in an Organized Health Care Education/Training Program

## 2019-04-02 DIAGNOSIS — E039 Hypothyroidism, unspecified: Secondary | ICD-10-CM

## 2019-04-03 NOTE — Telephone Encounter (Signed)
I can approve a manufacturer change if the previous manufacturer is no longer providing the medication. That is fine.  I would like the patient to have a follow up appointment to check his thyroid levels 3-4 weeks after use from a new formulation change. Please let me know if you need anything else.

## 2019-04-03 NOTE — Telephone Encounter (Signed)
Called pharmacy to give approval.  This will actually be for next fill as they have the same brand for the script fill that is ready to be picked up.  Gave verbal for future refill.  LMOVM for wife to call back to inform of the following:  1. Medicine is ready for pick up, the did not need verbal for this fill, unsure why [facility] told her they did.  2.  He will need bloodwork 4 weeks after next fill (of the different brand).  So about one month after his next pickup.  Jone Baseman, CMA

## 2019-04-03 NOTE — Telephone Encounter (Signed)
Per Shanda Bumps, I will call pharmacy when Dr. Dareen Piano approves of the manufacturer change.  Glennie Hawk, CMA

## 2019-04-03 NOTE — Telephone Encounter (Signed)
Patient wife calling concerning her husbands levothyroxine refill. Pharmacy has been trying to get this consent to change the manufacturer. Patient has been without his medication since last Friday. Wife doesn't understand why this hasn't been taken care of. Please call wife back at [phone] when this has been done.

## 2019-07-28 ENCOUNTER — Other Ambulatory Visit: Payer: Self-pay

## 2019-07-28 DIAGNOSIS — E039 Hypothyroidism, unspecified: Secondary | ICD-10-CM

## 2019-07-28 MED ORDER — LEVOTHYROXINE SODIUM 200 MCG PO TABS: ORAL_TABLET | ORAL | 0 refills | Status: DC

## 2019-07-28 NOTE — Telephone Encounter (Signed)
Please let patient know he needs appointment for thyroid check for further medication refills.  thanks

## 2019-07-29 NOTE — Telephone Encounter (Signed)
Looks like pt is scheduled for an appt. Deseree Bruna Potter, CMA

## 2019-08-25 ENCOUNTER — Ambulatory Visit (INDEPENDENT_AMBULATORY_CARE_PROVIDER_SITE_OTHER): Payer: Self-pay | Admitting: Student in an Organized Health Care Education/Training Program

## 2019-08-25 ENCOUNTER — Encounter: Payer: Self-pay | Admitting: Student in an Organized Health Care Education/Training Program

## 2019-08-25 ENCOUNTER — Other Ambulatory Visit: Payer: Self-pay

## 2019-08-25 ENCOUNTER — Ambulatory Visit (HOSPITAL_COMMUNITY)
Admission: RE | Admit: 2019-08-25 | Discharge: 2019-08-25 | Disposition: A | Discharge status: Home / Self Care | Payer: Self-pay | Source: Ambulatory Visit | Attending: Family Medicine | Admitting: Family Medicine

## 2019-08-25 VITALS — BP 122/88 | HR 75 | Ht 64.0 in | Wt 190.0 lb

## 2019-08-25 DIAGNOSIS — K219 Gastro-esophageal reflux disease without esophagitis: Secondary | ICD-10-CM

## 2019-08-25 DIAGNOSIS — E039 Hypothyroidism, unspecified: Secondary | ICD-10-CM

## 2019-08-25 DIAGNOSIS — R0789 Other chest pain: Secondary | ICD-10-CM

## 2019-08-25 DIAGNOSIS — E890 Postprocedural hypothyroidism: Secondary | ICD-10-CM

## 2019-08-25 NOTE — Assessment & Plan Note (Addendum)
Omeprazole not controlling symptoms of burning chest pain that is relieved with belching. EKG obtained which was normal. Referred to GI for further evaluation.

## 2019-08-25 NOTE — Patient Instructions (Signed)
It was a pleasure to see you today!  To summarize our discussion for this visit:  We are checking your thyroid levels today. I will refill your medication and make dose adjustments if they are needed.  For your pain, your heart looks normal and I do not suspect this to be the cause. I suspect it is from acid reflux. I will send you to the GI specialist doctor since the medication you have been on is not helping.  Some additional health maintenance measures we should update are: Health Maintenance Due  Topic Date Due  . COVID-19 Vaccine (1) Never done  . TETANUS/TDAP  03/27/2018  .   Please return to our clinic to see me in 1 year or sooner if needed.  Call the clinic at [phone] if your symptoms worsen or you have any concerns.   Thank you for allowing me to take part in your care,  Dr. Jamelle Rushing   Enfermedad de reflujo gastroesofgico en los adultos Gastroesophageal Reflux Disease, Adult El reflujo gastroesofgico (RGE) ocurre cuando el cido del estmago sube por el tubo que conecta la boca con el estmago (esfago). Normalmente, la comida baja por el esfago y se mantiene en el [address], donde se la digiere. Cuando una persona tiene RGE, los alimentos y el cido estomacal suelen volver al esfago. Usted puede tener una enfermedad llamada enfermedad de reflujo gastroesofgico (ERGE) si el reflujo:  Sucede a menudo.  Causa sntomas frecuentes o muy intensos.  Causa problemas tales como dao en el esfago. Cuando esto ocurre, el esfago duele y se hincha (inflama). Con el tiempo, la ERGE puede ocasionar pequeos agujeros (lceras) en el revestimiento del esfago. Cules son las causas? Esta afeccin se debe a un problema en el msculo que se encuentra entre el esfago y [place]. Cuando este msculo est dbil o no es normal, no se cierra correctamente para impedir que los alimentos y el cido regresen del Teaching laboratory technician. El msculo puede debilitarse debido a lo  siguiente:  El consumo de [place].  [place].  Tener cierto tipo de hernia (hernia de hiato).  Consumo de alcohol.  Ciertos alimentos y bebidas, como caf, chocolate, cebollas y [place]. Qu incrementa el riesgo? Es ms probable que tenga esta afeccin si:  Tiene sobrepeso.  Tiene una enfermedad que afecta el tejido conjuntivo.  [place] antiinflamatorios no esteroideos (AINE). Cules son los signos o los sntomas? Los sntomas de esta afeccin incluyen:  Acidez estomacal.  Dificultad o dolor al tragar.  Sensacin de Warehouse manager un bulto en la garganta.  Sabor amargo en la boca.  Mal aliento.  Tener una gran cantidad de saliva.  Estmago inflamado o con Dentist.  Eructos.  Dolor en el pecho. El dolor de pecho puede deberse a distintas afecciones. Asegrese de Science writer a su mdico si tiene Journalist, newspaper.  Falta de aire o respiracin ruidosa (sibilancias).  Tos constante (crnica) o durante la noche.  Desgaste de la superficie de los dientes (esmalte dental).  Prdida de peso. Cmo se trata? El tratamiento depender de la gravedad de los sntomas. El mdico puede sugerirle lo siguiente:  Cambios en la dieta.  Medicamentos.  [place]. Siga estas indicaciones en su casa: Comida y bebida   Siga una dieta como se lo haya indicado el mdico. Es posible que deba evitar alimentos y bebidas, por ejemplo: ? Caf y t (con o sin cafena). ? Bebidas que contengan alcohol. ? Bebidas energticas y deportivas. ? Bebidas gaseosas y refrescos. ? Chocolate y  cacao. ? Menta y [place]. ? Ajo y cebolla. ? Rbano picante. ? Alimentos cidos y condimentados. Estos incluyen todos los tipos de pimientos, [place] en polvo, curry en polvo, vinagre, salsas picantes y Manpower Inc. ? Ctricos y sus jugos, por ejemplo, naranjas, limones y limas. ? Alimentos que AutoNation. Estos incluyen salsa roja, [place], salsa picante y pizza con salsa de [place]. ? Alimentos  fritos y Radio broadcast assistant. Estos incluyen donas, papas fritas, papitas fritas de bolsa y aderezos con alto contenido de [place]. ? Carnes con alto contenido de [place]. Estas incluye los perros calientes, chuletas o costillas, embutidos, jamn y tocino. ? Productos lcteos ricos en grasas, como leche [place], [place] y [place] crema.  Consuma pequeas cantidades de comida con ms frecuencia. Evite consumir porciones abundantes.  Evite beber grandes cantidades de lquidos con las comidas.  Evite comer 2 o 3horas antes de acostarse.  Evite recostarse inmediatamente despus de comer.  No haga ejercicios enseguida despus de comer. Estilo de vida   No consuma ningn producto que contenga nicotina o tabaco. Estos incluyen cigarrillos, cigarrillos electrnicos y tabaco para Higher education careers adviser. Si necesita ayuda para dejar de fumar, consulte al MeadWestvaco.  Intente reducir Schering[employer]Plough de estrs. Si necesita ayuda para hacer esto, consulte al mdico.  Si tiene sobrepeso, baje una cantidad de peso saludable para usted. Consulte a su mdico para bajar de peso de Cisco. Indicaciones generales  Est atento a cualquier cambio en los sntomas.  Tome los medicamentos de venta libre y los recetados solamente como se lo haya indicado el mdico. No tome aspirina, ibuprofeno ni otros AINE a menos que el mdico lo autorice.  Use ropa holgada. No use nada apretado alrededor de la cintura.  Levante (eleve) la cabecera de la cama aproximadamente 6pulgadas (15cm).  Evite inclinarse si al hacerlo empeoran los sntomas.  Concurra a todas las visitas de seguimiento como se lo haya indicado el mdico. Esto es importante. Comunquese con un mdico si:  Aparecen nuevos sntomas.  Adelgaza y no sabe por qu.  Tiene problemas para tragar o le duele cuando traga.  Tiene sibilancias o tos persistente.  Los sntomas no mejoran con Dispensing optician.  Tiene la voz ronca. Solicite ayuda inmediatamente si:  CIGNA, el cuello, la [place], los dientes o la espalda.  Se siente transpirado, mareado o tiene una sensacin de desvanecimiento.  Siente falta de aire o Tourist information centre manager.  Vomita y el vmito tiene un aspecto similar a la sangre o a los posos de caf.  Pierde el conocimiento (se desmaya).  Las deposiciones (heces) son sanguinolentas o negras.  No puede tragar, beber o comer. Resumen  Si una persona tiene enfermedad de reflujo gastroesofgico (ERGE), los alimentos y el cido estomacal suben al esfago y causan sntomas o problemas tales como dao en el esfago.  El tratamiento depender de la gravedad de los sntomas.  Siga una Lexicographer se lo haya indicado el [place] todos los medicamentos solamente como se lo haya indicado el mdico. Esta informacin no tiene Marine scientist el consejo del mdico. Asegrese de hacerle al mdico cualquier pregunta que tenga. Document Revised: 10/11/2017 Document Reviewed: 10/11/2017 Elsevier Patient Education  [place].

## 2019-08-25 NOTE — Assessment & Plan Note (Addendum)
TSH levels to be checked. Levothyroxine continued.

## 2019-08-25 NOTE — Progress Notes (Addendum)
° ° °  SUBJECTIVE:   CHIEF COMPLAINT / HPI: thyroid medication refill  [name] is a 44 y.o. male with a past medical history of thyroid disease who presents for a check up on his thyroid. He reports feeling tired, being anxious, being irritable for no apparent reason, and gaining weight. He does not report any constipation or diarrhea, arthritis, hot or cold intolerance, excessive sweating, or muscle aches.  GERD- He also has some burning pain in his epigastric/left chest that radiates up to his shoulder and lasts for up to an hour or two. This pain is alleviated with belching. It is not exacerbated by eating. It is non-exertional. Denies associated nausea/diophoresis/SOB. Has used omeprazole with some relief. Has had weight gain. He drinks 5-6 beers per day and denies NSAID use.  PERTINENT  PMH / PSH:  [name] has been diagnosed with thyroid disease and GERD in the past. Hx of thyroidectomy. He is currently taking levothyroxine and omeprazole. He reports drinking 5 alcoholic drinks per day.    OBJECTIVE:   BP 122/88    Pulse 75    Ht 5\' 4"  (1.626 m)    Wt 190 lb (86.2 kg)    SpO2 98%    BMI 32.61 kg/m   General: well appearing, conversant, in no acute distress HEENT: thyroid gland mobile, non-tender Cardio: normal S1/S2, rate and rhythm, no m/r/g  ASSESSMENT/PLAN:   GERD Omeprazole not controlling symptoms of burning chest pain that is relieved with belching. EKG obtained which was normal. Referred to GI for further evaluation.  POSTSURGICAL HYPOTHYROIDISM TSH levels to be checked. Levothyroxine continued.   , Medical Student [facility]    RESIDENT ATTESTATION    I have seen and examined this patient.     I have discussed the findings and exam with the MS and agree with the above note, which I have edited appropriately in BLUE. I helped develop the management plan that is described in the MS's note, and I agree  with the content.   Samantha Crimes, DO PGY-2 Family Medicine Resident

## 2019-08-26 ENCOUNTER — Other Ambulatory Visit: Payer: Self-pay | Admitting: Student in an Organized Health Care Education/Training Program

## 2019-08-26 ENCOUNTER — Telehealth: Payer: Self-pay

## 2019-08-26 LAB — TSH: TSH: 4.77 u[IU]/mL — ABNORMAL HIGH (ref 0.450–4.500)

## 2019-08-26 MED ORDER — LEVOTHYROXINE SODIUM 200 MCG PO CAPS: 1.0000 | ORAL_CAPSULE | ORAL | 1 refills | Status: AC

## 2019-08-26 MED ORDER — LEVOTHYROXINE SODIUM 25 MCG PO CAPS: 1.0000 | ORAL_CAPSULE | Freq: Every day | ORAL | 1 refills | Status: DC

## 2019-08-26 NOTE — Telephone Encounter (Signed)
Received phone call from Merla Riches at [facility] pharmacy regarding patient's levothyroxine prescription. Pharmacist needs to verify dosage as well as requesting rx be changed from capsules to tablets.  Spoke with Dr. Dareen Piano. Received verbal instructions for clarification as well as verbal okay to change from capsules to tablets.   Returned call to Merla Riches and gave verbal order to change.   FYI to PCP  Veronda Prude, RN

## 2019-08-28 NOTE — Telephone Encounter (Signed)
Thanks hannah !

## 2019-09-02 ENCOUNTER — Encounter: Payer: Self-pay | Admitting: Physician Assistant

## 2019-10-08 ENCOUNTER — Encounter: Payer: Self-pay | Admitting: Physician Assistant

## 2019-10-08 ENCOUNTER — Ambulatory Visit: Payer: Self-pay | Admitting: Physician Assistant

## 2019-10-08 ENCOUNTER — Other Ambulatory Visit (INDEPENDENT_AMBULATORY_CARE_PROVIDER_SITE_OTHER): Payer: Self-pay

## 2019-10-08 VITALS — BP 128/86 | HR 88 | Ht 62.5 in | Wt 184.0 lb

## 2019-10-08 DIAGNOSIS — R142 Eructation: Secondary | ICD-10-CM

## 2019-10-08 DIAGNOSIS — K219 Gastro-esophageal reflux disease without esophagitis: Secondary | ICD-10-CM

## 2019-10-08 MED ORDER — PANTOPRAZOLE SODIUM 40 MG PO TBEC: 40.0000 mg | DELAYED_RELEASE_TABLET | Freq: Every day | ORAL | 8 refills | Status: AC

## 2019-10-08 NOTE — Progress Notes (Signed)
Subjective:    Patient ID: [name], male    DOB: 16-Oct-1975, 43 y.o.   MRN: [id number]  HPI [name] is a pleasant 44 year old Hispanic male, non-English-speaking who was accompanied by an interpreter today and new to GI.  He was referred by [facility]  family practice.  He has history of hypertension, prior thyroidectomy, with postsurgical hypothyroidism, and history of daily EtOH use. Patient relates that he has been requiring medication for reflux symptoms and chest pain over the past 3 to 4 years.  He feels that his chest pain is secondary to indigestion and is relieved by belching.  He denies any dysphagia or odynophagia.  He denies any abdominal pain.  He is currently taking omeprazole 20 mg p.o. every morning.  He feels that this helps but definitely does not control his symptoms. On further questioning he has been drinking 5-6 beers daily, and does not follow any sort of a antireflux regimen or diet.  Review of Systems Pertinent positive and negative review of systems were noted in the above HPI section.  All other review of systems was otherwise negative.  Outpatient Encounter Medications as of 10/08/2019  Medication Sig   cetirizine (ZYRTEC) 10 MG tablet Take 10 mg by mouth daily.   Levothyroxine Sodium 200 MCG CAPS Take 1 capsule by mouth every morning.   Levothyroxine Sodium 25 MCG CAPS Take 1 capsule (25 mcg total) by mouth daily before breakfast.   omeprazole (PRILOSEC) 20 MG capsule Take 1 capsule (20 mg total) by mouth daily.   pantoprazole (PROTONIX) 40 MG tablet Take 1 tablet (40 mg total) by mouth daily.   [DISCONTINUED] doxycycline (VIBRA-TABS) 100 MG tablet Take 1 tablet (100 mg total) by mouth 2 (two) times daily.   [DISCONTINUED] naproxen (NAPROSYN) 500 MG tablet Take 1 tablet (500 mg total) by mouth 2 (two) times daily with a meal.   [DISCONTINUED] Simethicone 80 MG TABS Please take after meals and at bedtime as needed.   No facility-administered encounter  medications on file as of 10/08/2019.   No Known Allergies Patient Active Problem List   Diagnosis Date Noted   Infected sebaceous cyst 11/08/2018   Elevated LFTs 09/29/2013   Hypercholesteremia 09/29/2013   BPH (benign prostatic hyperplasia) 12/15/2011   Overweight(278.02) 12/15/2011   Elevated blood pressure 09/19/2011   Chest pain 06/05/2011   Alcohol abuse 10/04/2009   GERD 10/04/2009   POSTSURGICAL HYPOTHYROIDISM 03/27/2008   Social History   Socioeconomic History   Marital status: Single    Spouse name: Not on file   Number of children: 2   Years of education: Not on file   Highest education level: Not on file  Occupational History   Occupation: Holiday representative  Tobacco Use   Smoking status: Former Smoker    Types: Cigarettes    Quit date: 06/08/2019    Years since quitting: 0.3   Smokeless tobacco: Never Used   Tobacco comment: smokes very little, 2 for the whole year  Vaping Use   Vaping Use: Never used  Substance and Sexual Activity   Alcohol use: Yes    Alcohol/week: 2.0 - 6.0 standard drinks    Types: 2 - 6 Cans of beer per week    Comment: 2-6 drinks at a time, 2-3 times per week   Drug use: No   Sexual activity: Yes  Other Topics Concern   Not on file  Social History Narrative   Not on file   Social Determinants of Health   Financial Resource  Strain:    Difficulty of Paying Living Expenses:   Food Insecurity:    Worried About Programme researcher, broadcasting/film/video in the Last Year:    Barista in the Last Year:   Transportation Needs:    Freight forwarder (Medical):    Lack of Transportation (Non-Medical):   Physical Activity:    Days of Exercise per Week:    Minutes of Exercise per Session:   Stress:    Feeling of Stress :   Social Connections:    Frequency of Communication with Friends and Family:    Frequency of Social Gatherings with Friends and Family:    Attends Religious Services:    Active Member of Clubs  or Organizations:    Attends Engineer, structural:    Marital Status:   Intimate Partner Violence:    Fear of Current or Ex-Partner:    Emotionally Abused:    Physically Abused:    Sexually Abused:     Mr. Molyneux family history includes Breast cancer in his sister; Diabetes in his brother, father, mother, and sister; Heart disease in his brother and father; Hypertension in his brother, brother, father, mother, and sister.      Objective:    Vitals:   10/08/19 1450  BP: (!) 128/86  Pulse: 88    Physical Exam Well-developed well-nourished Hispanic male in no acute distress.  Pleasant, accompanied by his wife and an interpreter height, Weight, 184 BMI 33.1  HEENT; nontraumatic normocephalic, EOMI, PE RR LA, sclera anicteric. Oropharynx; not done Neck; supple, no JVD Cardiovascular; regular rate and rhythm with S1-S2, no murmur rub or gallop Pulmonary; Clear bilaterally Abdomen; soft, nontender, nondistended, no palpable mass or hepatosplenomegaly, bowel sounds are active Rectal; not done today Skin; benign exam, no jaundice rash or appreciable lesions Extremities; no clubbing cyanosis or edema skin warm and dry Neuro/Psych; alert and oriented x4, grossly nonfocal mood and affect appropriate       Assessment & Plan:   #88 44 year old Hispanic male with history of GERD manifested with intermittent daily chest pain relieved by belching.  He has been on low-dose omeprazole over the past couple of years which is helpful but does not control symptoms.  #2 obesity 3.  Status post thyroidectomy with postsurgical hypothyroidism 4.  Daily EtOH use  Plan; Long discussion with the patient today regarding need to decrease his alcohol intake.  Advised no more than 1 beer per day.  I suspect significantly decreasing alcohol intake will significantly improve reflux symptoms. We also discussed an antireflux diet and regimen and he was provided with these in  Spanish. Check H. pylori antibody and treat if positive Start pantoprazole 40 mg p.o. every morning AC breakfast.  Prescription sent and refill x6 Patient was advised to follow-up in 6 to 8 weeks if his symptoms have not significantly improved and at that point would proceed with further evaluation including endoscopy. Patient will be established with Dr. Rhea Belton.  Elijah Phommachanh S Chrissa Meetze PA-C 10/08/2019   Cc: Anderson, Chelsey L, DO

## 2019-10-08 NOTE — Patient Instructions (Signed)
If you are age 45 or older, your body mass index should be between 23-30. Your Body mass index is 33.12 kg/m. If this is out of the aforementioned range listed, please consider follow up with your Primary Care Provider.  If you are age 45 or younger, your body mass index should be between 19-25. Your Body mass index is 33.12 kg/m. If this is out of the aformentioned range listed, please consider follow up with your Primary Care Provider.   Your provider has requested that you go to the basement level for lab work before leaving today. Press "B" on the elevator. The lab is located at the first door on the left as you exit the elevator.   START Protonix 40 mg 1 tablet every morning before breakfast. This has been sent to your pharmacy.  Work on decreasing your beer/Alcohol intake to none.  Follow up in 2 month with Amy Esterwood PA-C, if you are not feeling better.   Opciones de alimentos para pacientes adultos con enfermedad de reflujo gastroesofgico Food Choices for Gastroesophageal Reflux Disease, Adult Si tiene enfermedad de reflujo gastroesofgico (ERGE), los alimentos que consume y los hbitos de alimentacin son muy importantes. Elegir los alimentos adecuados puede ayudar a Paramedicaliviar las molestias ocasionadas por la Hartford FinancialERGE. Considere la posibilidad de trabajar con un especialista en dieta y nutricin (nutricionista) que lo ayude a Software engineerhacer elecciones saludables. Qu pautas generales debo seguir?  Plan de alimentacin  Elija alimentos saludables con bajo contenido de grasa, como frutas, verduras, cereales integrales, productos lcteos descremados, carne magra de vaca, de pescado y de ave.  Haga comidas pequeas con frecuencia en lugar de tres comidas abundantes al da. Coma lentamente, en un ambiente distendido. Evite agacharse o recostarse hasta despus de 2 o 3horas de haber comido.  Limite los alimentos con alto contenido graso como las carnes grasas o los alimentos fritos.  Limite el  consumo de Gailaceites, Strattanvillemanteca y Indiamargarina a menos de 8 cucharaditas al Futures traderda.  Evite lo siguiente: ? Consumir alimentos que le ocasionen sntomas. Pueden ser distintos para cada persona. Lleve un registro de los alimentos para identificar aquellos que le ocasionen sntomas. ? Consumir alcohol. ? Beber grandes cantidades de lquido con las comidas. ? Comer 2 o 3 horas antes de acostarse.  Cocine los alimentos utilizando mtodos que no sean la fritura. Esto puede Writerincluir hornear, Technical sales engineeremparrillar y hervir. Estilo de vida  Mantenga un peso saludable. Pregunte a su mdico cul es el peso saludable para usted. Si debe perder peso, hable con su mdico para hacerlo de manera segura.  Realice actividad fsica durante, al menos, 30 minutos 5 das por semana o ms, o segn lo indicado por su mdico.  Evite usar ropa ajustada alrededor de la cintura y Naval architectel pecho.  No consuma ningn producto que contenga nicotina o tabaco, como cigarrillos y Administrator, Civil Servicecigarrillos electrnicos. Si necesita ayuda para dejar de fumar, consulte al mdico.  Duerma con la cabecera de la cama elevada. Use una cua debajo del colchn o bloques debajo del armazn de la cama para Pharmacologistmantener la cabecera de la cama elevada. Qu alimentos no se recomiendan? Esta podra no ser Raytheonuna lista completa. Hable con el nutricionista sobre las mejores opciones alimenticias para usted. Carbohidratos Pasteles o panes sin levadura con grasa agregada. Tostadas francesas. Verduras Verduras fritas en abundante aceite. Papas fritas. Cualquier verdura que est preparada con grasa agregada. Cualquier verdura que le ocasione sntomas. Para algunas personas, estas pueden incluir tomates y productos con tomate, Burr Oakajes, cebollas y  ajo, y rbanos picantes. Frutas Cualquier fruta que est preparada con grasa agregada. Cualquier fruta que le ocasione sntomas. Para algunas personas, estas pueden incluir, las frutas ctricas como naranja, pomelo, pia y limn. Carnes y otros  alimentos ricos en protenas Carnes de alto contenido graso como carne grasa de vaca o cerdo, salchichas, costillas, Val[place], salchicha, salame y tocino. Carnes o protenas fritas, lo que incluye pescado frito y pollo frito. Nueces y [place] de frutos secos. Lcteos Leche entera y [place] con chocolate. PPG Industries. Crema. Helados. Queso crema. Batidos con WPS Resources. Bebidas Caf y t negro, con o sin cafena. Bebidas carbonatadas. Refrescos. Bebidas energizantes. Jugo de fruta hecho con frutas cidas (como naranja o pomelo). Jugo de tomate. Bebidas alcohlicas. Grasas y Barnes & Noble. Margarina. Lardo. Mantequilla clarificada. Dulces y postres Chocolate y cacao. Rosquillas. Condimentos y otros alimentos [place]. Menta y mentol. Cualquier condimento, hierbas o aderezos que le ocasionen sntomas. Para algunas personas, esto puede incluir curry, salsa picante o aderezos para ensalada a base de vinagre. Resumen  Si tiene enfermedad de reflujo gastroesofgico (ERGE), las elecciones de alimentos y [place] de vida son muy importantes para ayudar a Paramedic las molestias de la [place].  Haga comidas pequeas con frecuencia en lugar de tres comidas abundantes al da. Coma lentamente, en un ambiente distendido. Evite agacharse o recostarse hasta despus de 2 o 3horas de haber comido.  Limite los alimentos con alto contenido graso como la carne grasa o los alimentos fritos. Esta informacin no tiene Theme park manager el consejo del mdico. Asegrese de hacerle al mdico cualquier pregunta que tenga. Document Revised: 06/19/2016 Document Reviewed: 01/01/2013 Elsevier Patient Education  2020 Elsevier Inc.   Enfermedad de reflujo gastroesofgico en los adultos Gastroesophageal Reflux Disease, Adult El reflujo gastroesofgico (RGE) ocurre cuando el cido del estmago sube por el tubo que conecta la boca con el estmago (esfago). Normalmente, la comida baja por el esfago y se mantiene en el [address],  donde se la digiere. Sin embargo, cuando una persona tiene [place], los alimentos y el cido estomacal suelen volver al esfago. Si esto se vuelve un problema ms grave, a la persona se le puede diagnosticar una enfermedad llamada enfermedad de reflujo gastroesofgico (ERGE). La ERGE ocurre cuando el reflujo:  Sucede a menudo.  Causa sntomas frecuentes o graves.  Causa problemas tales como dao en el esfago. Cuando el cido del Proofreader en contacto con el esfago, el cido puede provocar dolor (inflamacin) en el esfago. Con el tiempo, pueden formarse pequeos agujeros (lceras) en el revestimiento del esfago. Cules son las causas? Esta afeccin se debe a un problema en el msculo que se encuentra entre el esfago y Investment banker, corporate (esfnter esofgico inferior, o EEI). Normalmente, el EEI se cierra una vez que la comida pasa a travs del esfago hasta el Rosalie. Cuando el EEI se encuentra debilitado o tiene alguna anomala, no se cierra por completo, y eso permite que tanto la comida como el jugo gstrico, que es cido, [place] a subir por el esfago. El EEI puede debilitarse a causa de ciertas sustancias alimenticias, medicamentos y Pharmacist, community, que incluyen:  El consumo de [place].  [place].  Tener una hernia de hiato.  Consumo de alcohol.  Ciertos alimentos y bebidas, como caf, chocolate, cebollas y [place]. Qu incrementa el riesgo? Es ms probable que tenga esta afeccin si:  Tiene un aumento del Runner, broadcasting/film/video.  Tiene un trastorno del tejido conjuntivo.  [place] antiinflamatorios no esteroideos (AINE). Cules son los signos o los sntomas? Los  sntomas de esta afeccin incluyen:  Acidez estomacal.  Dificultad o dolor al tragar.  Sensacin de Warehouse manager un bulto en la garganta.  Sabor amargo en la boca.  Mal aliento.  Gran cantidad de saliva.  Estmago inflamado o con Dentist.  Eructos.  Dolor en el pecho. El dolor de pecho puede deberse a distintas afecciones. Es  importante que consulte al mdico si tiene dolor de [place].  Dificultad para respirar o sibilancias.  Tos constante (crnica) o tos nocturna.  Desgaste del Engineer, structural.  Prdida de peso. Cmo se diagnostica? El mdico le har una historia clnica y un examen fsico. Para determinar si tiene ERGE leve o grave, el mdico tambin puede controlar cmo usted reacciona al tratamiento. Tambin pueden Constellation Energy, que [place] los siguientes:  Un estudio para examinarle el Calvin y el esfago con una cmara pequea (endoscopa).  Una prueba para medir el grado de Technical sales engineer.  Una prueba para medir cunta presin hay en el esfago.  Un estudio de deglucin con bario comn o modificado para ver la forma, el tamao y el funcionamiento del esfago. Cmo se trata? El Ferris del tratamiento es ayudar a Paramedic los sntomas y Automotive engineer las complicaciones. El tratamiento de esta afeccin puede variar segn la gravedad de los sntomas. El mdico puede recomendarle lo siguiente:  Cambios en la dieta.  Medicamentos.  [place]. Siga estas indicaciones en su casa: Comida y bebida   Siga la dieta recomendada por el mdico. Esto puede incluir evitar ciertos alimentos y bebidas, por ejemplo: ? Caf y t (con o sin cafena). ? Bebidas que contengan alcohol. ? Bebidas energticas y deportivas. ? Bebidas gaseosas o refrescos. ? Chocolate y cacao. ? Menta y esencia de [place]. ? Ajo y cebolla. ? Rbano picante. ? Alimentos condimentados, picantes y cidos, por ejemplo, todos los tipos de pimientas, [place] en polvo, curry en polvo, vinagre, salsas picantes y Occidental Petroleum. ? Ctricos y sus jugos, por ejemplo, naranjas, limones y limas. ? Alimentos a base de [address], como salsa de [place], [place], salsa picante y pizza con salsa de [employer]. ? Alimentos fritos y [place], [place] donas, papas fritas y aderezos ricos en grasas. ? Carnes con alto contenido de grasa, como salchichas, y cortes  de carnes rojas y blancas con mucha grasa, por ejemplo, chuletas o costillas, embutidos, jamn y tocino. ? Productos lcteos ricos en grasas, como leche [place], [place] y [place] crema.  Haga comidas pequeas y frecuentes Freight forwarder de comidas abundantes.  Evite beber grandes cantidades de lquidos con las comidas.  Evite comer 2 o 3horas antes de acostarse.  Evite recostarse inmediatamente despus de comer.  No haga ejercicios enseguida despus de comer. Estilo de vida   No consuma ningn producto que contenga nicotina o tabaco, como cigarrillos, cigarrillos electrnicos y tabaco de Theatre manager. Si necesita ayuda para dejar de fumar, consulte al American Express.  Trate de reducir el estrs con mtodos como el yoga o la meditacin. Si necesita ayuda para reducir J. [employer] Penney de estrs, consulte al mdico.  Si tiene sobrepeso, baje hasta llegar a un peso saludable para usted. Pdale consejos al mdico para bajar de peso de [place] segura. Indicaciones generales  Est atento a cualquier cambio en los sntomas.  Tome los medicamentos de venta libre y los recetados solamente como se lo haya indicado el mdico. No tome aspirina, ibuprofeno ni otros AINE a menos que el mdico se lo indique.  Use ropa holgada. No use nada apretado alrededor  de la cintura que haga presin sobre el abdomen.  Levante (eleve) la cabecera de la cama aproximadamente 6pulgadas (15cm).  Evite inclinarse si al hacerlo empeoran los sntomas.  Concurra a todas las visitas de [address] se lo haya indicado el mdico. Esto es importante. Comunquese con un mdico si:  Tiene los siguientes sntomas: ? Sntomas nuevos. ? Prdida de peso sin causa aparente. ? Dificultad o dolor al tragar. ? Sibilancias o una tos persistente. ? Voz ronca.  Los sntomas no mejoran con Scientist, research (medical). Solicite ayuda inmediatamente si:  Goldman Sachs, el cuello, la [place], los dientes o la espalda.  Se siente  transpirado, mareado o tiene una sensacin de desvanecimiento.  Siente dolor intenso en el pecho o le falta el aire.  Vomita y el vmito tiene un aspecto similar a la sangre o a los posos de caf.  Se desmaya.  Tiene heces sanguinolentas o negras.  No puede tragar, beber o comer. Resumen  El reflujo gastroesofgico ocurre cuando el cido del estmago sube al esfago. La ERGE es una enfermedad en la que el reflujo ocurre con frecuencia, causa sntomas frecuentes o graves, o causa problemas tales como dao en el esfago.  El tratamiento de esta afeccin puede variar segn la gravedad de los sntomas. El mdico puede indicarle que siga una dieta y haga cambios en su estilo de vida, tome medicamentos o se someta a [place].  Comunquese con un mdico si tiene sntomas nuevos o los sntomas empeoran.  Tome los medicamentos de venta libre y los recetados solamente como se lo haya indicado el mdico. No tome aspirina, ibuprofeno ni otros AINE a menos que el mdico se lo indique.  Concurra a todas las visitas de [address] se lo haya indicado el mdico. Esto es importante. Esta informacin no tiene Theme park manager el consejo del mdico. Asegrese de hacerle al mdico cualquier pregunta que tenga. Document Revised: 10/11/2017 Document Reviewed: 10/11/2017 Elsevier Patient Education  2020 Arvin[employer]itor.

## 2019-10-09 ENCOUNTER — Other Ambulatory Visit: Payer: Self-pay

## 2019-10-09 DIAGNOSIS — K219 Gastro-esophageal reflux disease without esophagitis: Secondary | ICD-10-CM

## 2019-10-09 DIAGNOSIS — R142 Eructation: Secondary | ICD-10-CM

## 2019-10-09 LAB — H. PYLORI ANTIBODY, IGG: H Pylori IgG: NEGATIVE

## 2019-10-15 LAB — SPECIMEN STATUS REPORT

## 2019-10-15 LAB — HELICOBACTER PYLORI  ANTIBODY, IGM

## 2019-10-16 ENCOUNTER — Other Ambulatory Visit: Payer: Self-pay

## 2019-10-16 DIAGNOSIS — K219 Gastro-esophageal reflux disease without esophagitis: Secondary | ICD-10-CM

## 2019-10-21 NOTE — Progress Notes (Signed)
Addendum: Reviewed and agree with assessment and management plan. Dayon Witt M, MD  

## 2019-10-22 ENCOUNTER — Telehealth: Payer: Self-pay | Admitting: Physician Assistant

## 2019-10-22 NOTE — Telephone Encounter (Signed)
Patient returned the call and is aware of providers request to re-do stool test. He will pick up kit either this afternoon or tomorrow.

## 2019-10-22 NOTE — Telephone Encounter (Signed)
Called patient in reference to stool test left voicemail

## 2019-10-30 ENCOUNTER — Other Ambulatory Visit: Payer: Self-pay

## 2019-10-30 DIAGNOSIS — K219 Gastro-esophageal reflux disease without esophagitis: Secondary | ICD-10-CM

## 2019-11-01 LAB — H. PYLORI ANTIGEN, STOOL: H pylori Ag, Stl: NEGATIVE

## 2019-11-07 ENCOUNTER — Telehealth: Payer: Self-pay | Admitting: Physician Assistant

## 2019-11-07 NOTE — Telephone Encounter (Signed)
Noted  

## 2019-11-07 NOTE — Telephone Encounter (Signed)
Pt returned my call and was given results.

## 2019-11-07 NOTE — Telephone Encounter (Signed)
Left message to call back to give lab results for H-Pylori.

## 2020-01-09 ENCOUNTER — Ambulatory Visit (INDEPENDENT_AMBULATORY_CARE_PROVIDER_SITE_OTHER): Payer: Self-pay | Admitting: Student in an Organized Health Care Education/Training Program

## 2020-01-09 ENCOUNTER — Other Ambulatory Visit: Payer: Self-pay

## 2020-01-09 VITALS — BP 124/75 | HR 72 | Ht 64.0 in | Wt 194.4 lb

## 2020-01-09 DIAGNOSIS — K117 Disturbances of salivary secretion: Secondary | ICD-10-CM

## 2020-01-09 DIAGNOSIS — E039 Hypothyroidism, unspecified: Secondary | ICD-10-CM

## 2020-01-09 DIAGNOSIS — M25511 Pain in right shoulder: Secondary | ICD-10-CM

## 2020-01-09 DIAGNOSIS — G8929 Other chronic pain: Secondary | ICD-10-CM

## 2020-01-09 NOTE — Patient Instructions (Signed)
It was a pleasure to see you today!  To summarize our discussion for this visit:  Your shoulder seems to have an episode of pain from a previous injury. I would like you to do some rehabilitation exercises to try to help the joint strengthen. I am also prescribing medication to help with swelling and pain. If not improved, we can try to put a steroid injection into the joint.   We are checking your thyroid function today. Please drink plenty of water.    Su hombro parece tener un episodio de dolor debido a una lesin anterior. Me gustara que hicieras algunos ejercicios de rehabilitacin para tratar de ayudar a fortalecer la articulacin. Tambin estoy recetando medicamentos para aliviar la hinchazn y Chief Technology Officer. Si no mejora, podemos intentar poner una inyeccin de esteroides en la articulacin.  Hoy estamos revisando su funcin tiroidea. Beba mucha agua.  Some additional health maintenance measures we should update are: Health Maintenance Due  Topic Date Due  . COVID-19 Vaccine (1) Never done  . TETANUS/TDAP  03/27/2018  . INFLUENZA VACCINE  10/12/2019  .    Call the clinic at [phone] if your symptoms worsen or you have any concerns.   Thank you for allowing me to take part in your care,  Dr. Jamelle Rushing   Rotator Cuff Tear Rehab After Surgery Ask your health care provider which exercises are safe for you. Do exercises exactly as told by your health care provider and adjust them as directed. It is normal to feel mild stretching, pulling, tightness, or discomfort as you do these exercises. Stop right away if you feel sudden pain or your pain gets worse. Do not begin these exercises until told by your health care provider. Stretching and range-of-motion exercises These exercises warm up your muscles and joints and improve the movement and flexibility of your shoulder. These exercises also help to relieve pain. Shoulder pendulum In this exercise, you let the injured arm  dangle toward the floor and then swing it like a clock pendulum. 1. Stand near a table or counter that you can hold onto for balance. 2. Bend forward at the waist and let your left / right arm hang straight down. Use your other arm to support you and help you stay balanced. 3. Relax your left / right arm and shoulder muscles, and move your hips and your trunk so your left / right arm swings freely. Your arm should swing because of the motion of your body, not because you are using your arm or shoulder muscles. 4. Keep moving your hips and trunk so your arm swings in the following directions, as told by your health care provider: ? Side to side. ? Forward and backward. ? In clockwise and counterclockwise circles. 5. Slowly return to the starting position. Repeat __________ times, or for __________ seconds per direction. Complete this exercise __________ times a day. Shoulder flexion, seated In this exercise, you raise your arm in front of your body until you feel a stretch in your injured shoulder. 1. Sit in a stable chair so your left / right forearm can rest on a flat surface. Your elbow should rest at a height that keeps your upper arm next to your body. 2. Keeping your left / right shoulder relaxed, lean forward at the waist and let your hand slide forward (flexion). Stop when you feel a stretch in your shoulder, or when you reach the angle that is recommended by your health care provider. 3. Hold for __________  seconds. 4. Slowly return to the starting position. Repeat __________ times. Complete this exercise __________ times a day. Shoulder flexion, standing In this exercise, you raise your arm in front of your body (flexion) until you feel a stretch in your injured shoulder. 1. Stand and hold a broomstick, a cane, or a similar object. Place your hands a little more than shoulder width apart on the object. Your left / right hand should be palm-up, and your other hand should be  palm-down. 2. Keep your elbow straight and your shoulder muscles relaxed. Push the stick up with your healthy arm to raise your left / right arm in front of your body, and then over your head until you feel a stretch in your shoulder. ? Avoid shrugging your shoulder while you raise your arm. Keep your shoulder blade tucked down toward the middle of your back. ? Keep your left / right shoulder muscles relaxed. 3. Hold for __________ seconds. 4. Slowly return to the starting position. Repeat __________ times. Complete this exercise __________ times a day. Shoulder abduction, active-assisted You will need a stick, broom handle, or similar object to help you (assist) in doing this exercise. 1. Lie on your back. This is the supine position. Hold a broomstick, a cane, or a similar object. 2. Place your hands a little more than shoulder width apart on the object. Your left / right hand should be palm-up, and your other hand should be palm-down. 3. Keeping your shoulder relaxed, push the stick to raise your left / right arm out to your side (abduction) and then over your head. Use your other hand to help move the stick. Stop when you feel a stretch in your shoulder, or when you reach the angle that is recommended by your health care provider. ? Avoid shrugging your shoulder while you raise your arm. Keep your shoulder blade tucked down toward the middle of your back. 4. Hold for __________ seconds. 5. Slowly return to the starting position. Repeat __________ times. Complete this exercise __________ times a day. Shoulder flexion, active-assisted  1. Lie on your back. You may bend your knees for comfort. 2. Hold a broomstick, a cane, or a similar object so that your hands are about shoulder width apart. Your palms should face toward your feet. 3. Raise your left / right arm over your head, then behind your head toward the floor (flexion). Use your other hand to help you do this (active-assisted). Stop when  you feel a gentle stretch in your shoulder, or when you reach the angle that is recommended by your health care provider. 4. Hold for __________ seconds. 5. Use the stick and your other arm to help you return your left / right arm to the starting position. Repeat __________ times. Complete this exercise __________ times a day. External rotation  1. Sit in a stable chair without armrests, or stand up. 2. Tuck a soft object, such as a folded towel or a small ball, under your left / right upper arm. 3. Hold a broomstick, a cane, or a similar object with your palms face-down, toward the floor. Bend your elbows to a 90-degree angle (right angle), and keep your hands about shoulder width apart. 4. Straighten your healthy arm and push the stick across your body, toward your left / right side. Keep your left / right arm bent. This will rotate your left / right forearm away from your body (external rotation). 5. Hold for __________ seconds. 6. Slowly return to the starting  position. Repeat __________ times. Complete this exercise __________ times a day. Strengthening exercises These exercises build strength and endurance in your shoulder. Endurance is the ability to use your muscles for a long time, even after they get tired. Shoulder flexion, isometric  1. Stand or sit in a doorway, facing the door frame. 2. Keep your left / right arm straight and make a gentle fist with your hand. Place your fist against the door frame. Only your fist should be touching the frame. Keep your upper arm at your side. 3. Gently press your fist against the door frame, as if you are trying to raise your arm above your head (isometric shoulder flexion). ? Avoid shrugging your shoulder while you press your hand into the door frame. Keep your shoulder blade tucked down toward the middle of your back. 4. Hold for __________ seconds. 5. Slowly release the tension, and relax your muscles completely before you repeat the  exercise. Repeat __________ times. Complete this exercise __________ times a day. Shoulder abduction, isometric 1. Stand or sit in a doorway. Your left / right arm should be closest to the door frame. 2. Keep your left / right arm straight, and place the back of your hand against the door frame. Only your hand should be touching the frame. Keep the rest of your arm close to your side. 3. Gently press the back of your hand against the door frame, as if you are trying to raise your arm out to the side (isometric shoulder abduction). ? Avoid shrugging your shoulder while you press your hand into the door frame. Keep your shoulder blade tucked down toward the middle of your back. 4. Hold for __________ seconds. 5. Slowly release the tension, and relax your muscles completely before you repeat the exercise. Repeat __________ times. Complete this exercise __________ times a day. Internal rotation, isometric This is an exercise in which you press your palm against a door frame without moving your shoulder joint (isometric). 1. Stand or sit in a doorway, facing the door frame. 2. Bend your left / right elbow, and place the palm of your hand against the door frame. Only your palm should be touching the frame. Keep your upper arm at your side. 3. Gently press your hand against the door frame, as if you are trying to push your arm toward your abdomen (internal rotation). Gradually increase the pressure until you are pressing as hard as you can. Stop increasing the pressure if you feel shoulder pain. ? Avoid shrugging your shoulder while you press your hand into the door frame. Keep your shoulder blade tucked down toward the middle of your back. 4. Hold for __________ seconds. 5. Slowly release the tension, and relax your muscles completely before you repeat the exercise. Repeat __________ times. Complete this exercise __________ times a day. External rotation, isometric This is an exercise in which you  press the back of your wrist against a door frame without moving your shoulder joint (isometric). 1. Stand or sit in a doorway, facing the door frame. 2. Bend your left / right elbow and place the back of your wrist against the door frame. Only the back of your wrist should be touching the frame. Keep your upper arm at your side. 3. Gently press your wrist against the door frame, as if you are trying to push your arm away from your abdomen (external rotation). Gradually increase the pressure until you are pressing as hard as you can. Stop increasing the pressure if  you feel pain. ? Avoid shrugging your shoulder while you press your wrist into the door frame. Keep your shoulder blade tucked down toward the middle of your back. 4. Hold for __________ seconds. 5. Slowly release the tension, and relax your muscles completely before you repeat the exercise. Repeat __________ times. Complete this exercise __________ times a day. This information is not intended to replace advice given to you by your health care provider. Make sure you discuss any questions you have with your health care provider. Document Revised: 06/21/2018 Document Reviewed: 06/11/2018 Elsevier Patient Education  2020 [employer]

## 2020-01-09 NOTE — Progress Notes (Signed)
    SUBJECTIVE:   CHIEF COMPLAINT / HPI: throat dryness, bilateral arm numbness Spanish interpretor used for this visit.  Throat and mouth dryness- 15 days or more. Denies changes in foods, medications, or activities. Never happened before. Drinking plenty of water. No nausea or vomiting, fever, dry eyes. No difficulty with swallowing.  Doesn't know how his blood sugars have been. No cough. No other close contacts with same symptoms.   Shoulder pain on right and numbness in both arms. He fell down 3 years ago while running and had injury but no surgery. Difficulty sleeping due to pain.  Denies decreased strength or ROM. Feels clicking in his shoulder when he moves it. Denies redness, swelling, increased warmth of the joint.  OBJECTIVE:   BP 124/75   Pulse 72   Ht 5\' 4"  (1.626 m)   Wt 194 lb 6.4 oz (88.2 kg)   SpO2 97%   BMI 33.37 kg/m   General: NAD, pleasant, able to participate in exam HEENT: mouth: MMM Neg cervical lymphadenopathy Cardiac: RRR, normal heart sounds, no murmurs. 2+ radial and PT pulses bilaterally Respiratory: CTAB, normal effort, No wheezes, rales or rhonchi R shoulder: Observation: no gross abnormalities Palpation: mild tenderness near [facility] joint but not on the joint ROM: full with crepitus heard and sensation of tendon snapping over bone with large arm circles Strength: intact Sensation: intact Special tests: positive lift off/obrien Neg drop arm, or empty can Neg apprehension test Neg neer Skin: warm and dry, no rashes noted Neuro: alert and oriented, no focal deficits Psych: Normal affect and mood  ASSESSMENT/PLAN:   Xerostomia Checking TSH today Consider allergies, sjogren, weather related, diet related - recommend increase fluid intake - monitor blood sugars  Update: TSH very high.  Called patient's cell phone twice and left voicemail on home phone through interpreting service. Asked patient to call back and confirm his levothyroxine dosage and  adherence and will have to increase dose if those are normal.  Shoulder pain, right Unlikely an acute injury. Not septic on exam. ROM, sensation and strength intact. 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  - given resistant therapy band and demonstrated therapies. Ideally formal PT but patient does not have insurance.  - prescribed muscle relaxer and naproxen for 2 weeks. - consider joint steroid injection if no improvement. - xrays and possible ortho referral, again, considering no insurance had shared decision making with patient on cost vs benefit at this time     [facility], DO [facility]

## 2020-01-10 LAB — TSH: TSH: 88.6 u[IU]/mL — ABNORMAL HIGH (ref 0.450–4.500)

## 2020-01-12 ENCOUNTER — Encounter: Payer: Self-pay | Admitting: Student in an Organized Health Care Education/Training Program

## 2020-01-12 DIAGNOSIS — K117 Disturbances of salivary secretion: Secondary | ICD-10-CM | POA: Insufficient documentation

## 2020-01-12 DIAGNOSIS — M25511 Pain in right shoulder: Secondary | ICD-10-CM | POA: Insufficient documentation

## 2020-01-12 MED ORDER — NAPROXEN 500 MG PO TBEC: 500.0000 mg | DELAYED_RELEASE_TABLET | Freq: Two times a day (BID) | ORAL | 0 refills | Status: AC

## 2020-01-12 MED ORDER — BACLOFEN 20 MG PO TABS: 20.0000 mg | ORAL_TABLET | Freq: Two times a day (BID) | ORAL | 0 refills | Status: AC

## 2020-01-12 NOTE — Assessment & Plan Note (Addendum)
Checking TSH today Consider allergies, sjogren, weather related, diet related - recommend increase fluid intake - monitor blood sugars  Update: TSH very high.  Called patient's cell phone twice and left voicemail on home phone through interpreting service. Asked patient to call back and confirm his levothyroxine dosage and adherence and will have to increase dose if those are normal.

## 2020-01-12 NOTE — Assessment & Plan Note (Addendum)
Unlikely an acute injury. Not septic on exam. ROM, sensation and strength intact. 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  - given resistant therapy band and demonstrated therapies. Ideally formal PT but patient does not have insurance.  - prescribed muscle relaxer and naproxen for 2 weeks. - consider joint steroid injection if no improvement. - xrays and possible ortho referral, again, considering no insurance had shared decision making with patient on cost vs benefit at this time

## 2020-01-21 ENCOUNTER — Telehealth: Payer: Self-pay

## 2020-01-21 NOTE — Telephone Encounter (Signed)
Patients wife calls nurse line following up with result letter received. Wife requesting a call back from PCP. Please call her with plan. You can leave a detailed VM if she does not answer.

## 2020-01-26 NOTE — Telephone Encounter (Signed)
Called and left detailed message. Requested they call back to confirm his compliance with medication and taking at least prior to other foods. If has good compliance, will need to increase his dose and/or refer to endocrinology for poor control of hypothyroidism

## 2020-03-07 ENCOUNTER — Other Ambulatory Visit: Payer: Self-pay | Admitting: Student in an Organized Health Care Education/Training Program

## 2020-03-15 ENCOUNTER — Other Ambulatory Visit: Payer: Self-pay | Admitting: Student in an Organized Health Care Education/Training Program

## 2020-03-30 ENCOUNTER — Telehealth: Payer: Self-pay | Admitting: Student in an Organized Health Care Education/Training Program

## 2020-03-30 NOTE — Telephone Encounter (Signed)
On multiple occasions I have tried to reach out to patient in regard to his thyroid medications and never got ahold of him or family members.  Today I had interpretor try to call him at 3 different numbers that are listed in his demographics and they are all disconnected.  If he happens to reach out to the clinic,  My message would be that he needs to get his thyroid hormones checked again ASAP and we need to adjust his medications or send him to an endocrinology specialist.

## 2020-06-19 ENCOUNTER — Other Ambulatory Visit: Payer: Self-pay | Admitting: Family Medicine
